# Patient Record
Sex: Male | Born: 1950 | Race: White | Hispanic: Yes | Marital: Married | Smoking: Never smoker
Health system: Southern US, Community
[De-identification: ages and names within clinical notes are randomized; demographics above are authoritative.]

## PROBLEM LIST (undated history)

## (undated) DIAGNOSIS — G4733 Obstructive sleep apnea (adult) (pediatric): Secondary | ICD-10-CM

## (undated) DIAGNOSIS — C801 Malignant (primary) neoplasm, unspecified: Secondary | ICD-10-CM

## (undated) DIAGNOSIS — I471 Supraventricular tachycardia, unspecified: Secondary | ICD-10-CM

## (undated) DIAGNOSIS — M109 Gout, unspecified: Secondary | ICD-10-CM

## (undated) HISTORY — DX: Obstructive sleep apnea (adult) (pediatric): G47.33

## (undated) HISTORY — DX: Supraventricular tachycardia, unspecified: I47.10

## (undated) HISTORY — PX: NO PAST SURGERIES: SHX2092

---

## 2001-08-16 ENCOUNTER — Ambulatory Visit: Admission: RE | Admit: 2001-08-16 | Discharge: 2001-11-14 | Payer: Self-pay | Admitting: Radiation Oncology

## 2002-03-31 ENCOUNTER — Ambulatory Visit: Admission: RE | Admit: 2002-03-31 | Discharge: 2002-03-31 | Payer: Self-pay | Admitting: Radiation Oncology

## 2002-06-30 ENCOUNTER — Ambulatory Visit: Admission: RE | Admit: 2002-06-30 | Discharge: 2002-06-30 | Payer: Self-pay | Admitting: Radiation Oncology

## 2002-11-11 ENCOUNTER — Ambulatory Visit: Admission: RE | Admit: 2002-11-11 | Discharge: 2002-11-11 | Payer: Self-pay | Admitting: Radiation Oncology

## 2003-05-11 ENCOUNTER — Ambulatory Visit: Admission: RE | Admit: 2003-05-11 | Discharge: 2003-05-11 | Payer: Self-pay | Admitting: Radiation Oncology

## 2003-05-18 ENCOUNTER — Ambulatory Visit: Admission: RE | Admit: 2003-05-18 | Discharge: 2003-05-18 | Payer: Self-pay | Admitting: Radiation Oncology

## 2003-06-21 ENCOUNTER — Other Ambulatory Visit: Admission: RE | Admit: 2003-06-21 | Discharge: 2003-06-21 | Payer: Self-pay | Admitting: Otolaryngology

## 2003-10-19 ENCOUNTER — Ambulatory Visit: Admission: RE | Admit: 2003-10-19 | Discharge: 2003-10-19 | Payer: Self-pay | Admitting: Radiation Oncology

## 2003-11-02 ENCOUNTER — Ambulatory Visit: Admission: RE | Admit: 2003-11-02 | Discharge: 2003-11-02 | Payer: Self-pay | Admitting: Radiation Oncology

## 2004-05-02 ENCOUNTER — Ambulatory Visit: Admission: RE | Admit: 2004-05-02 | Discharge: 2004-05-02 | Payer: Self-pay | Admitting: Radiation Oncology

## 2004-06-19 ENCOUNTER — Ambulatory Visit: Admission: RE | Admit: 2004-06-19 | Discharge: 2004-06-19 | Payer: Self-pay | Admitting: Radiation Oncology

## 2005-01-02 ENCOUNTER — Ambulatory Visit: Admission: RE | Admit: 2005-01-02 | Discharge: 2005-01-06 | Payer: Self-pay | Admitting: Radiation Oncology

## 2005-01-03 ENCOUNTER — Ambulatory Visit (HOSPITAL_COMMUNITY): Admission: RE | Admit: 2005-01-03 | Discharge: 2005-01-03 | Payer: Self-pay | Admitting: Radiation Oncology

## 2005-05-16 ENCOUNTER — Inpatient Hospital Stay (HOSPITAL_COMMUNITY): Admission: EM | Admit: 2005-05-16 | Discharge: 2005-05-20 | Payer: Self-pay | Admitting: Emergency Medicine

## 2005-07-17 ENCOUNTER — Ambulatory Visit: Admission: RE | Admit: 2005-07-17 | Discharge: 2005-08-05 | Payer: Self-pay | Admitting: Family Medicine

## 2006-02-03 ENCOUNTER — Ambulatory Visit: Admission: RE | Admit: 2006-02-03 | Discharge: 2006-04-29 | Payer: Self-pay | Admitting: Radiation Oncology

## 2006-02-03 LAB — TSH: TSH: 1.046 u[IU]/mL (ref 0.350–5.500)

## 2006-08-27 ENCOUNTER — Ambulatory Visit: Admission: RE | Admit: 2006-08-27 | Discharge: 2006-11-25 | Payer: Self-pay | Admitting: Radiation Oncology

## 2006-12-03 IMAGING — CR DG CHEST 2V
2 series · 2 of 2 positions shown · non-contrast
Comparison: none

HISTORY: Fever, chills

CHEST 2 VIEWS:
Comparison 01/03/2005
Normal heart size, mediastinal contours, and vascularity.
Chronic bronchitic changes and bilateral apical scarring.
No definite acute infiltrate or effusion. 
No pneumothorax.
Mild scoliosis.

[w chest pa]
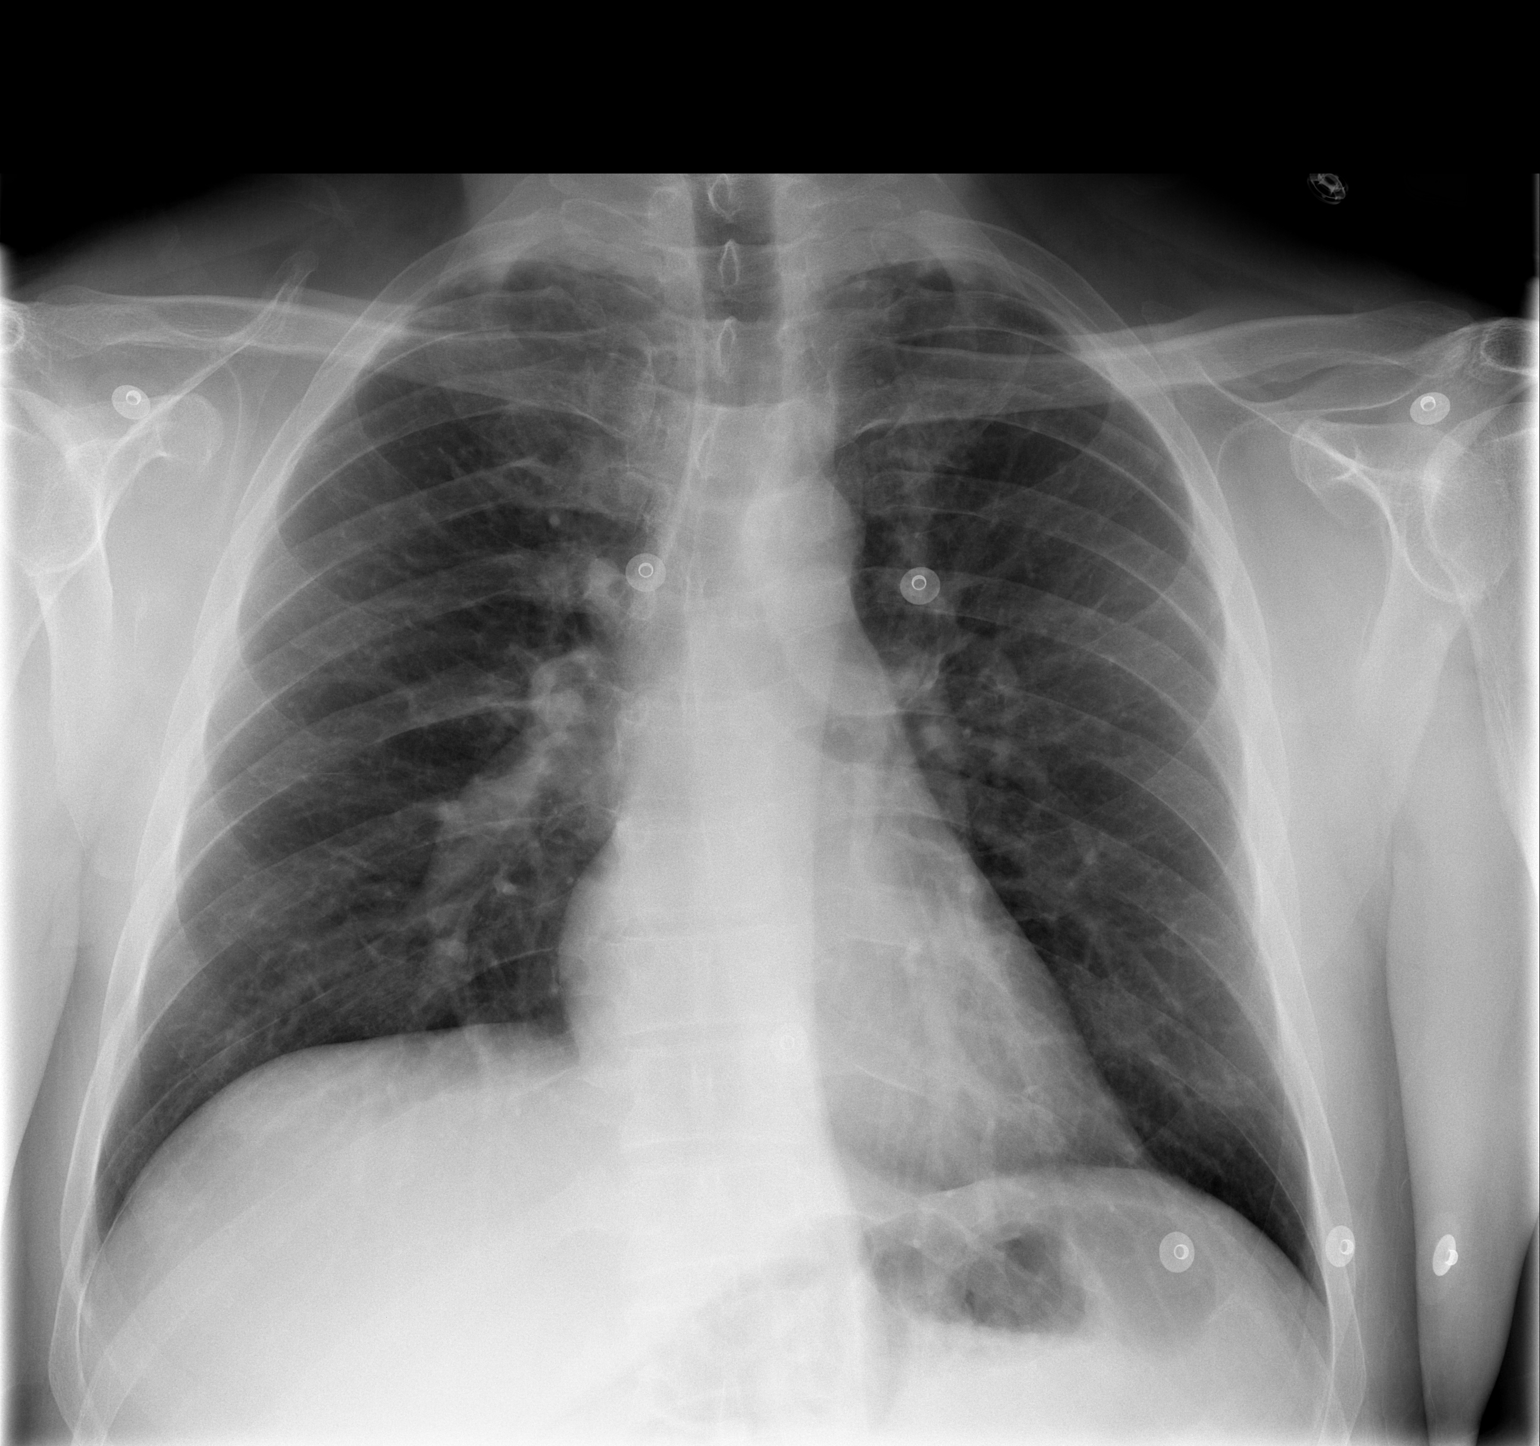

[w chest lat]
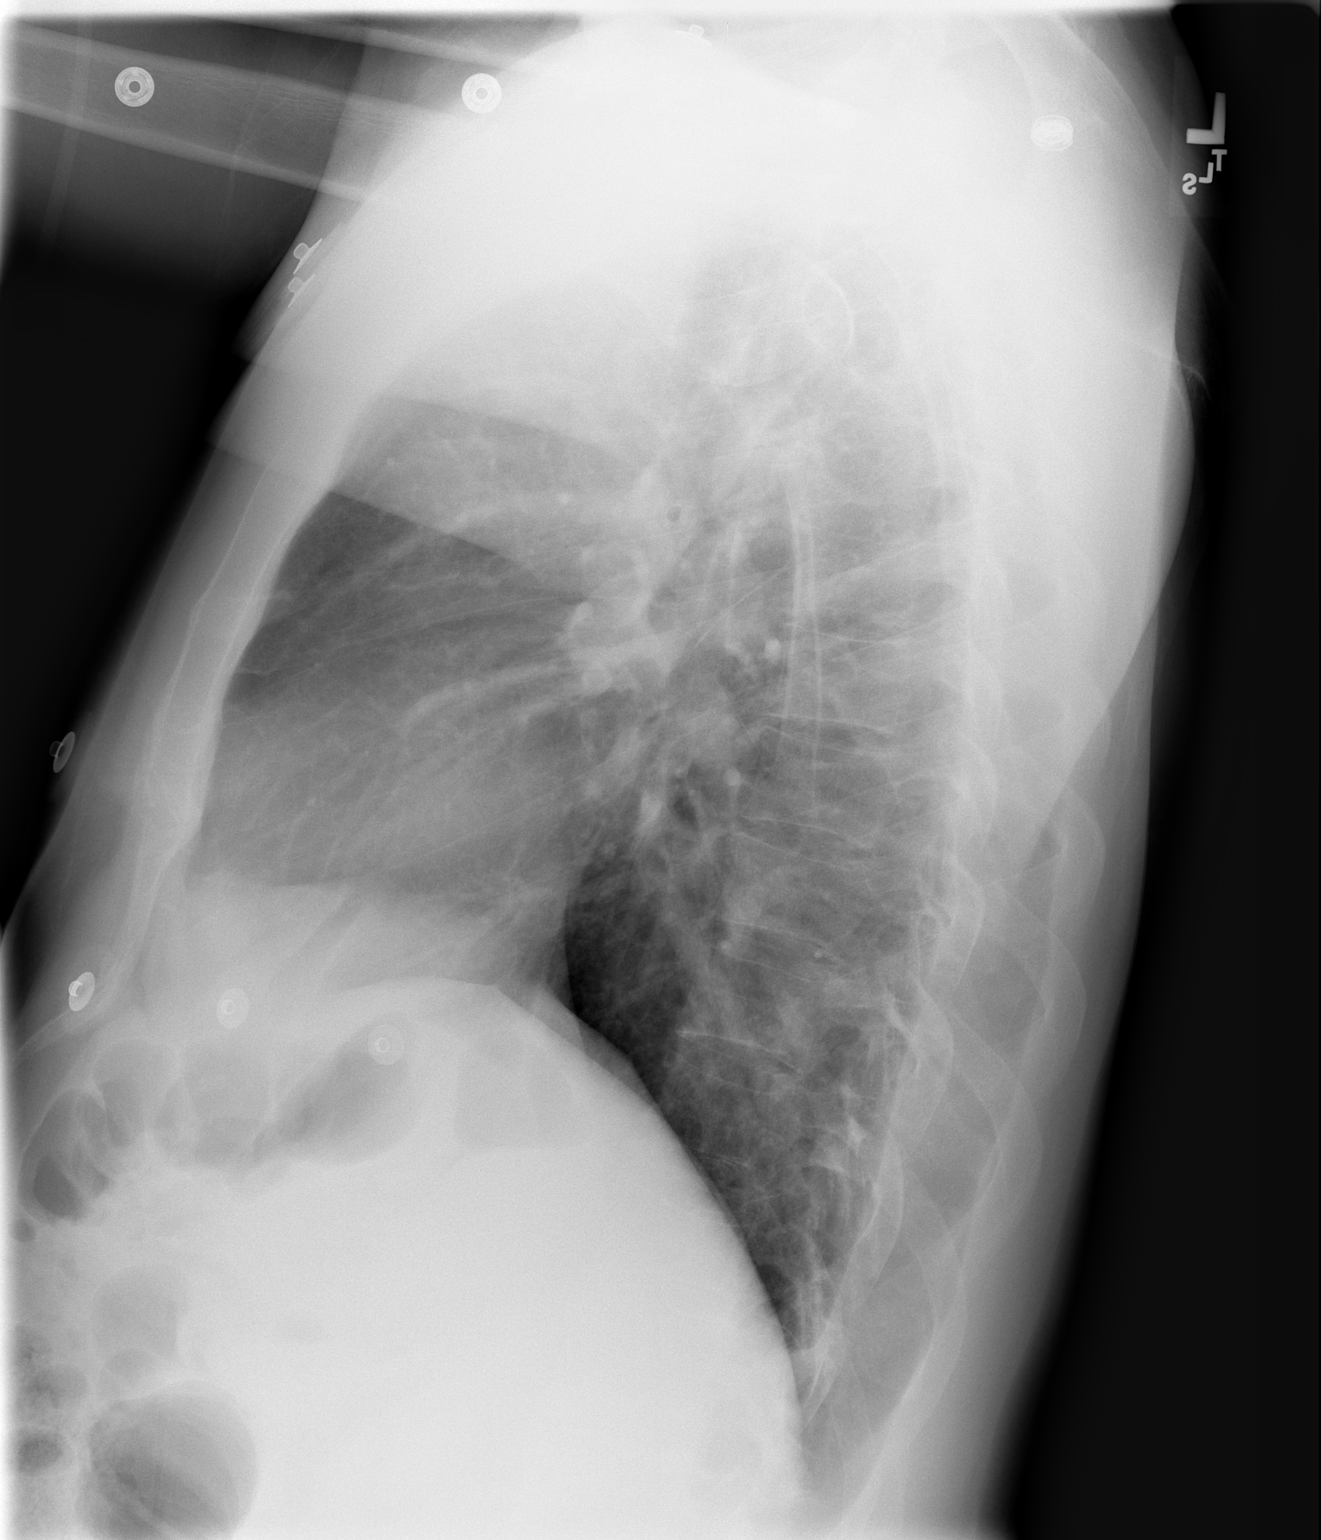

[2 of 2 positions shown; findings below may reference images not displayed]

IMPRESSION: Chronic bronchitic changes with biapical scarring.
No acute abnormalities.

## 2010-07-19 NOTE — H&P (Signed)
[name], [name]               ACCOUNT NO.:  [id number]   MEDICAL RECORD NO.:  [id number]          PATIENT TYPE:  INP   LOCATION:  5504                         FACILITY:  [facility]   PHYSICIAN:  Kela Millin, M.D.DATE OF BIRTH:  1950/07/30   DATE OF ADMISSION:  05/16/2005  DATE OF DISCHARGE:                                HISTORY & PHYSICAL   PRIMARY CARE PHYSICIAN:  Dellis Anes. Heller, M.D.   CHIEF COMPLAINT:  Fevers and chills.   HISTORY OF PRESENT ILLNESS:  The patient is a 60 year old male who presents  with complaints of chills x1 day.  He states that he had had generalized  malaise for a couple of days.  He denies nausea, vomiting, dysuria, cough,  diarrhea, hematochezia, and no hematemesis.  He states that he traveled to  [place] in December of 2006, but he took his malaria prophylaxis.   He was seen in the ER, and a malaria smear was negative.  His temperature  was 102.9.  Urinalysis was consistent with a UTI, and he is admitted to the  [facility] hospitalist service for further evaluation and management.   PAST MEDICAL HISTORY:  1.  History of laryngeal cancer, stage I, status post radiation and in      remission.  2.  History of gout, diet controlled.   MEDICATIONS:  None.   ALLERGIES:  No known drug allergies.   SOCIAL HISTORY:  Denies tobacco.  Also denies alcohol.   FAMILY HISTORY:  His dad has Hodgkin's disease.  His mother died of muscle  cancer in her 30s.   REVIEW OF SYSTEMS:  As per HPI.  Other review of systems negative.   PHYSICAL EXAMINATION:  GENERAL:  The patient is a middle-aged male in no  apparent distress.  VITAL SIGNS:  Temperature is 98.6, initially 102.9.  Blood pressure is  102/63, improved from 97/59.  Pulse is 103.  O2 saturation is 99%.  HEENT:  Pupils equal, round and reactive to light.  Extraocular movements  intact.  Sclerae anicteric.  Slightly dry mucous membranes.  NECK:  Supple.  No adenopathy and no thyromegaly.  No JVD.  LUNGS:   Clear to auscultation bilaterally.  No crackles or wheezes.  CARDIOVASCULAR:  Regular rate and rhythm.  Normal S1 and S2.  ABDOMEN:  Soft.  Bowel sounds present.  Nontender, nondistended.  No  organomegaly and no masses palpable.  EXTREMITIES:  No cyanosis or edema.   LABORATORY DATA:  The white cell count is 9.7, hemoglobin 13.4, hematocrit  37.5, platelet count 246, and the neutrophil count is 96%.  Sodium is 135,  potassium 3.8, chloride 104, BUN 15, glucose 128, pH is 7.51, PCO2 of 32,  and creatinine of 1.0.  Urinalysis is cloudy in appearance.  Specific  gravity 1.020.  Urine nitrite is positive.  Leukocyte esterase is large.  Urine WBCs are 21-50.  Chest x-ray shows bronchitic changes with biapical  scarring.  Otherwise, no acute abnormalities.   ASSESSMENT AND PLAN:  1.  Urinary tract infection with sepsis syndrome.  Obtain blood and urine      cultures.  Empiric antibiotics.  IV fluids and supportive care.  2.  History of laryngeal cancer.  As discussed above, status post radiation,      in remission.  3.  History of gout, diet controlled.      Kela Millin, M.D.  Electronically Signed     ACV/MEDQ  D:  05/16/2005  T:  05/16/2005  Job:  [id number]   cc:   Dellis Anes. Idell Pickles, M.D.  Fax: [phone]

## 2010-07-19 NOTE — Discharge Summary (Signed)
[name], KUMPF NO.:  [id number]   MEDICAL RECORD NO.:  [id number]          PATIENT TYPE:  INP   LOCATION:  5504                         FACILITY:  [facility]   PHYSICIAN:  Jackie Plum, M.D.DATE OF BIRTH:  11/29/1950   DATE OF ADMISSION:  05/15/2005  DATE OF DISCHARGE:  05/20/2005                                 DISCHARGE SUMMARY   ADDENDUM:  The patient was seen on rounds today.  The patient had been seen by Hollice Espy, M.D., yesterday.  He has persistent elevation of his PSA on  repeat.  He denies any fever or chills, nausea or vomiting.  He is feeling  strong and actually wants to go home.  He feels back to his baseline self.  His vital signs are stable with discharge BP of 119/78, pulse 77,  respirations 18, temperature 98.3 degrees Fahrenheit.  I called Dr.  Wanda Plump' office for appointment for the patient to be seen on May 22, 2005, at 1:50 p.m., whereupon he is to be evaluated for his elevated PSA.      Jackie Plum, M.D.  Electronically Signed     GO/MEDQ  D:  05/20/2005  T:  05/21/2005  Job:  [id number]   cc:   [place] [employer], M.D.  Fax: [phone]   Dellis Anes. Idell Pickles, M.D.  Fax: [phone]

## 2010-07-19 NOTE — Discharge Summary (Signed)
[name], [name] NO.:  [id number]   MEDICAL RECORD NO.:  [id number]          PATIENT TYPE:  INP   LOCATION:  5504                         FACILITY:  [facility]   PHYSICIAN:  Hollice Espy, M.D.DATE OF BIRTH:  [birth year]   DATE OF ADMISSION:  05/15/2005  DATE OF DISCHARGE:  05/20/2005                                 DISCHARGE SUMMARY   DISCHARGE DIAGNOSIS:  1.  Escherichia coli urinary tract infection.  2.  Secondary sepsis.  3.  Hypotension brought on by number 2.  4.  Elevated PSA, benign prostatic hypertrophy versus possible prostate      cancer.  5.  History of laryngeal cancer.  6.  History of gout.   DISCHARGE MEDICATIONS:  Cipro 500 mg 1 p.o. b.i.d. x 9 more days for a total  of 14 days of therapy, the patient is currently on no other medications.   HISTORY OF PRESENT ILLNESS:  The patient is a 60 year old white male with  past medical history of laryngeal cancer stage I status post radiation in  remission as well as gout who presented to the emergency room on the day of  admission with complaints of chills and rigors x one day.  He had no other  symptoms although he had traveled to [place] two months prior but told me he  had been taking his malaria prophylaxis.  The patient was seen in the  emergency room  and evaluated and malaria smear was negative.  He was,  however, noted to have a temperature of 102.9.  His urinalysis was  consistent with a UTI, and while he had a normal white count, he was found  to have a 96% neutrophil.  In addition, his blood pressure was slightly low  at 97/59.  The patient was admitted for UTI suspected sepsis and started on  IV Cipro and admitted for further evaluation.  Blood cultures were drawn on  admission.   HOSPITAL COURSE:  Problem 1:  Urinary tract infection and sepsis.  The patient was put on high  dose IV fluids at 150 mL an hour.  He continued to remain borderline low  blood pressure with a systolic no  better than 100 despite continued multiple  liters of IV fluid.  Blood cultures did come back positive for Enterobacter  aerogenes, however, this was still sensitive to Cipro and the patient has  been responding well.  When I discussed my initial concerns with the patient  about his urinary tract infection, I asked him to describe his urinating  habits.  He tells me as of late, he has had problems with inconsistent  draining, sometimes dribbling while urinating.  I suspect that the patient  has prostate issues, however, given his history of laryngeal cancer, I was  concerned that perhaps the other issue maybe a possible prostate cancer.  A  PSA was ordered on May 16, 2005, which came back elevated at 40 on March  17.  A subsequent PSA was ordered on March 17 which came back on March 18  slightly decreased at 39.  I discussed this with  urology and the plan most  likely is to check a follow up PSA which is still pending on the evening of  May 19, 2005.  If still elevated, urology will be formally consulted for a  possible biopsy.  In the meantime, the patient is tolerating antibiotics  well.  The plan will be for the patient to be discharged home on p.o.  antibiotics for another nine days for a total of 14 days of therapy.  Discharge diet will be regular.  Activities will be as tolerated.  He is  being discharged home with follow up in Dr. Jobe Gibbon office in the next 1-2  weeks.  Likely, the patient will need to be  started, if this is benign prostatic hypertrophy, on a BPH reducing agent  such as Proscar or Flomax, however, again we need to first rule out prostate  cancer given his history of laryngeal CA.  In regards to the patient's gout,  this remained stable and was not an active issue.      Hollice Espy, M.D.  Electronically Signed     SKK/MEDQ  D:  05/19/2005  T:  05/20/2005  Job:  [id number]   cc:   Dellis Anes. Idell Pickles, M.D.  Fax: [phone]

## 2012-05-08 ENCOUNTER — Encounter (HOSPITAL_COMMUNITY): Payer: Self-pay | Admitting: Emergency Medicine

## 2012-05-08 ENCOUNTER — Emergency Department (HOSPITAL_COMMUNITY)
Admission: EM | Admit: 2012-05-08 | Discharge: 2012-05-08 | Disposition: A | Discharge status: Home / Self Care | Payer: BC Managed Care – PPO | Attending: Emergency Medicine | Admitting: Emergency Medicine

## 2012-05-08 DIAGNOSIS — W260XXA Contact with knife, initial encounter: Secondary | ICD-10-CM | POA: Insufficient documentation

## 2012-05-08 DIAGNOSIS — Y929 Unspecified place or not applicable: Secondary | ICD-10-CM | POA: Insufficient documentation

## 2012-05-08 DIAGNOSIS — Y939 Activity, unspecified: Secondary | ICD-10-CM | POA: Insufficient documentation

## 2012-05-08 DIAGNOSIS — S61409A Unspecified open wound of unspecified hand, initial encounter: Secondary | ICD-10-CM | POA: Insufficient documentation

## 2012-05-08 HISTORY — DX: Gout, unspecified: M10.9

## 2012-05-08 HISTORY — DX: Malignant (primary) neoplasm, unspecified: C80.1

## 2012-05-08 NOTE — ED Notes (Signed)
Patient with laceration on left posterior hand.  Patient states that he accidentally cut his hand on pocket knife.  Bleeding controlled at this time.

## 2019-03-06 LAB — CBC WITH AUTO DIFFERENTIAL
Absolute Baso #: 0 10*3/uL (ref 0.0–0.2)
Absolute Eos #: 0.1 10*3/uL (ref 0.0–0.5)
Absolute Lymph #: 1 10*3/uL (ref 1.0–3.2)
Absolute Mono #: 0.4 10*3/uL (ref 0.3–1.0)
Basophils %: 0.6 % (ref 0.0–2.0)
Eosinophils %: 1.6 % (ref 0.0–7.0)
Hematocrit: 42.3 % (ref 38.0–52.0)
Hemoglobin: 14.6 g/dL (ref 13.0–17.3)
Immature Grans (Abs): 0.01 10*3/uL (ref 0.00–0.06)
Immature Granulocytes: 0.1 % (ref 0.1–0.6)
Lymphocytes: 14.1 % — ABNORMAL LOW (ref 15.0–45.0)
MCH: 31.4 pg (ref 27.0–34.5)
MCHC: 34.5 g/dL (ref 32.0–36.0)
MCV: 91 fL (ref 84.0–100.0)
MPV: 8.9 fL (ref 7.2–13.2)
Monocytes: 6.3 % (ref 4.0–12.0)
Neutrophils %: 77.3 % — ABNORMAL HIGH (ref 42.0–74.0)
Neutrophils Absolute: 5.5 10*3/uL (ref 1.6–7.3)
Platelets: 257 10*3/uL (ref 140–440)
RBC: 4.65 x10e6/mcL (ref 4.00–5.60)
RDW: 11.6 % (ref 11.0–16.0)
WBC: 7 10*3/uL (ref 3.8–10.6)

## 2019-03-06 LAB — BASIC METABOLIC PANEL
Anion Gap: 10 mmol/L (ref 2–17)
BUN: 17 mg/dL (ref 8–23)
CO2: 26 mmol/L (ref 22–29)
Calcium: 9.3 mg/dL (ref 8.8–10.2)
Chloride: 101 mmol/L (ref 98–107)
Creatinine: 0.8 mg/dL (ref 0.7–1.3)
GFR African American: 107 mL/min/{1.73_m2} (ref >=90)
GFR Non-African American: 92 mL/min/{1.73_m2} (ref >=90)
Glucose: 125 mg/dL — ABNORMAL HIGH (ref 70–99)
OSMOLALITY CALCULATED: 277 mOsm/kg (ref 270–287)
Potassium: 3.8 mmol/L (ref 3.5–5.3)
Sodium: 137 mmol/L (ref 135–145)

## 2019-03-06 LAB — TROPONIN T: Troponin T: <0.01 ng/mL (ref 0.000–0.010)

## 2019-03-06 LAB — TSH WITH REFLEX TO FT4: TSH: 1.41 mcIU/mL (ref 0.358–3.740)

## 2019-03-06 LAB — D-DIMER, QUANTITATIVE: D-Dimer, Quant: <=0.27 mcg/mL FEU (ref 0.19–0.51)

## 2019-03-06 LAB — MAGNESIUM: Magnesium: 2.3 mg/dL (ref 1.6–2.6)

## 2019-03-06 LAB — TROPONIN: TROP 5TH GEN STUDY: 10 ng/L (ref 0.0–12.0)

## 2019-03-06 NOTE — ED Notes (Signed)
ED Patient Education Note     Patient Education Materials Follows:  Cardiovascular     Palpitations    A palpitation is the feeling that your heartbeat is irregular or is faster than normal. It may feel like your heart is fluttering or skipping a beat. Palpitations are usually not a serious problem. However, in some cases, you may need further medical evaluation.      CAUSES    Palpitations can be caused by:     Smoking.     Caffeine or other stimulants, such as diet pills or energy drinks.     Alcohol.     Stress and anxiety.     Strenuous physical activity.     Fatigue.      Certain medicines.     Heart disease, especially if you have a history of irregular heart rhythms (arrhythmias), such as atrial fibrillation, atrial flutter, or supraventricular tachycardia.     An improperly working pacemaker or defibrillator.    DIAGNOSIS    To find the cause of your palpitations, your health care provider will take your medical history and perform a physical exam. Your health care provider may also have you take a test called an ambulatory electrocardiogram (ECG). An ECG records your heartbeat patterns over a 24-hour period. You may also have other tests, such as:     Transthoracic echocardiogram (TTE). During echocardiography, sound waves are used to evaluate how blood flows through your heart.     Transesophageal echocardiogram (TEE).      Cardiac monitoring. This allows your health care provider to monitor your heart rate and rhythm in real time.     Holter monitor. This is a portable device that records your heartbeat and can help diagnose heart arrhythmias. It allows your health care provider to track your heart activity for several days, if needed.     Stress tests by exercise or by giving medicine that makes the heart beat faster.    TREATMENT    Treatment of palpitations depends on the cause of your symptoms and can vary greatly. Most cases of palpitations do not require any treatment other than time, relaxation,  and monitoring your symptoms. Other causes, such as atrial fibrillation, atrial flutter, or supraventricular tachycardia, usually require further treatment.    HOME CARE INSTRUCTIONS     Avoid:    ? Caffeinated coffee, tea, soft drinks, diet pills, and energy drinks.    ? Chocolate.    ? Alcohol.     Stop smoking if you smoke.     Reduce your stress and anxiety. Things that can help you relax include:    ? A method of controlling things in your body, such as your heartbeats, with your mind (biofeedback).    ? Yoga.    ? Meditation.    ? Physical activity such as swimming, jogging, or walking.      Get plenty of rest and sleep.    SEEK MEDICAL CARE IF:     You continue to have a fast or irregular heartbeat beyond 24 hours.     Your palpitations occur more often.    SEEK IMMEDIATE MEDICAL CARE IF:     You have chest pain or shortness of breath.     You have a severe headache.     You feel dizzy or you faint.    MAKE SURE YOU:     Understand these instructions.     Will watch your condition.     Will get   help right away if you are not doing well or get worse.    This information is not intended to replace advice given to you by your health care provider. Make sure you discuss any questions you have with your health care provider.    Document Released: 02/15/2000 Document Revised: 02/22/2013 Document Reviewed: 11/02/2014  Elsevier Interactive Patient Education ?2016 [employer]

## 2019-03-06 NOTE — ED Provider Notes (Signed)
Palpitations-Dysrhythmia *ED        Patient:   Donald Newton, Donald Newton             MRN: [id number]            FIN: [id number]               Age:   69 years     Sex:  Male     DOB:  1950-06-16   Associated Diagnoses:   Palpitations   Author:   Volanda Napoleon      Basic Information   Time seen: Provider Seen (ST)   ED Provider/Time:    Howell Rucks LANE / 03/06/2019 11:37  .   Additional information: Chief Complaint from Nursing Triage Note   Chief Complaint  Chief Complaint: Pt felt like his heart was racing and a little queasy . asa 1/2 adult PTA (03/06/19 11:39:00).      History of Present Illness   Patient is a 69 year old male who presents to the emergency department for evaluation of palpitations.  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  He states he still feels "a little queasy," but otherwise offers no complaints.  He denies any syncope, diaphoresis, headache, chest pain, shortness of breath, abdominal pain, vomiting, lower extremity edema or calf tenderness.  He is currently on diltiazem, as he states his primary care provider told him his heart rate was fast approximately 1 month ago.  He denies any other changes to his routine or medications, known ill contacts, etc. He did take half of an aspirin prior to arrival.  Have a cardiologist known coronary artery disease, history of coagulopathy, DVT, PE or other cardiopulmonary issues.  No excessive caffeine or stimulant use.  He is a non-smoker and denies any illicit drug use..        Review of Systems   Constitutional symptoms:  Fatigue, no fever, no chills.    Eye symptoms:  No pain, no blurred vision.    ENMT symptoms:  No sore throat, no nasal congestion.    Respiratory symptoms:  No shortness of breath, no cough.    Cardiovascular symptoms:  Palpitations, tachycardia, no chest  pain, no syncope, no diaphoresis, no peripheral edema.    Gastrointestinal symptoms:  Nausea, no abdominal pain, no vomiting, no diarrhea.    Musculoskeletal symptoms:  No back pain, no Muscle pain.    Neurologic symptoms:  Dizziness, no headache, no altered level of consciousness.       Health Status   Allergies:    Allergic Reactions (All)  No Known Allergies.   Medications:  (Selected)   Documented Medications  Documented  diltiazem 180 mg/24 hours oral capsule, extended release: 180 mg, 1 cap, Oral, Daily, 30 cap, 0 Refill(s)  tamsulosin 0.4 mg oral capsule: 0.4 mg, 1 caps, Oral, Daily, 30 caps, 0 Refill(s).      Past Medical/ Family/ Social History   Medical history: Reviewed as documented in chart.   Surgical history: Reviewed as documented in chart.   Family history: Not significant.   Social history: Reviewed as documented in chart.   Problem list:    No qualifying data available  , per nurse's notes.      Physical Examination  Vital Signs   Vital Signs   08/05/7844 96:29 EST Systolic Blood Pressure 528 mmHg  HI    Diastolic Blood Pressure 80 mmHg    Temperature Oral 36.9 degC    Heart Rate Monitored 98 bpm    Respiratory Rate 14 br/min    SpO2 100 %   .   Measurements   03/06/2019 11:44 EST Body Mass Index est meas 26.60 kg/m2   03/06/2019 11:44 EST Body Mass Index Measured 26.60 kg/m2   03/06/2019 11:39 EST Height/Length Measured 182 cm    Weight Dosing 88.1 kg   .   General:  Alert, no acute distress.    Skin:  Warm, dry.    Head:  Normocephalic, atraumatic.    Eye:  Extraocular movements are intact, normal conjunctiva.    Cardiovascular:  Regular rate and rhythm, Normal peripheral perfusion, No edema.    Respiratory:  Lungs are clear to auscultation, respirations are non-labored, Symmetrical chest wall expansion.    Chest wall:  No tenderness, No deformity.    Gastrointestinal:  Soft, Nontender.    Neurological:  Alert and oriented to person, place, time, and situation, No focal neurological deficit  observed.    Psychiatric:  Cooperative, appropriate mood & affect, normal judgment.       Medical Decision Making   Differential Diagnosis::  Tachycardia, atrial fibrillation, atrial flutter, paroxysmal supraventricular tachycardia, anemia, premature atrial contraction, premature ventricular contraction, anxiety, hyperthyroidism, sinus tachycardia, medication reaction, electrolyte abnormality.    Rationale:  PA/NP reviewed with co-signing physician: ECG, lab results, medication, diagnosis, and plan of care.   Documents reviewed:  Emergency department nurses' notes.   Electrocardiogram:  Emergency Provider interpretation performed by me, rate 98, normal sinus rhythm, No ST-T changes, no ectopy, normal PR & QRS intervals.    Results review:  Lab results : Lab View   03/06/2019 12:21 EST Estimated Creatinine Clearance 97.34 mL/min   03/06/2019 12:05 EST WBC 7.0 x10e3/mcL    RBC 4.65 x10e6/mcL    Hgb 14.6 g/dL    HCT 42.3 %    MCV 91.0 fL    MCH 31.4 pg    MCHC 34.5 g/dL    RDW 11.6 %    Platelet 257 x10e3/mcL    MPV 8.9 fL    Neutro Auto 77.3 %  HI    Neutro Absolute 5.5 x10e3/mcL    Immature Grans Percent 0.1 %    Immature Grans Absolute 0.01 x10e3/mcL    Lymph Auto 14.1 %  LOW    Lymph Absolute 1.0 x10e3/mcL    Mono Auto 6.3 %    Mono Absolute 0.4 x10e3/mcL    Eosinophil Percent 1.6 %    Eos Absolute 0.1 x10e3/mcL    Basophil Auto 0.6 %    Baso Absolute 0.0 x10e3/mcL    D Dimer <=0.27 mcg/mL FEU    Sodium Lvl 137 mmol/L    Potassium Lvl 3.8 mmol/L    Chloride 101 mmol/L    CO2 26 mmol/L    Glucose Random 125 mg/dL  HI    BUN 17 mg/dL    Creatinine Lvl 0.8 mg/dL    AGAP 10 mmol/L    Osmolality Calc 277 mOsm/kg    Calcium Lvl 9.3 mg/dL    eGFR AA 107 mL/min/1.24m???    eGFR Non-AA 92 mL/min/1.767m??    Magnesium Lvl 2.3 mg/dL    Trop T Quant <0.010 ng/mL    Trop 5th Gen Study 10.0 ng/L    TSH Reflex 1.410 mcIU/mL   .  Notes:  69 year old male who had a brief episode of palpitations, malaise, and registered tachycardia in  the 1 teens on his Fitbit watch.  He denies any associated syncope, shortness of breath, chest discomfort, nausea.  He is otherwise been in his usual state of health.  He is resting comfortably, no acute distress.  Hemodynamically stable and nontoxic in appearance.  Heart sounds are regular and has good peripheral perfusion.  Lungs are clear to auscultation bilaterally.  No lower extremity edema, calf tenderness, or palpable cords.  EKG shows normal sinus rhythm without any evidence of acute ischemia or arrhythmia today.  He is placed on cardiac monitor and no arrhythmias are seen during his stay.  Labs including CBC, BMP, troponin, TSH, D-dimer are reviewed and unremarkable.  He has no significant risk factors for coronary artery disease or PE.  Unsure etiology for symptoms, and will refer to cardiology for further evaluation.  In the meantime, will start him on low-dose aspirin in case he does have some paroxysmal A. fib which was not witnessed here.  No contraindications to this.  We reviewed signs and symptoms which to return to the emergency department and he voices understanding.  Patient is stable for discharge.      Impression and Plan   Diagnosis   Palpitations (ICD10-CM R00.2, Discharge, Medical)   Plan   Condition: Improved, Stable.    Disposition: Discharged: to home.    Patient was given the following educational materials: Palpitations.    Follow up with: JEFFREY RIEDER-MD, Physician - Cardiologist Within 1 week Please follow-up with your primary care provider and recommend establishing care with a cardiologist.  If you call tomorrow morning, they can help you set up an appointment.  Recommend starting a an 81 mg baby aspirin once daily.  Continue your prescribed diltiazem.  Return to the ER for any worsening or new concerning symptoms as discussed..    Counseled: Patient, Regarding diagnosis, Regarding diagnostic results, Regarding treatment plan, Patient indicated understanding of instructions.     Signature Line     Electronically Signed on 03/06/2019 01:09 PM EST   ________________________________________________   Roxanna Mew LANE-PA-C, PA-C               Modified by: Roxanna Mew LANE-PA-C, PA-C on 03/06/2019 12:58 PM EST      Modified by: Roxanna Mew LANE-PA-C, PA-C on 03/06/2019 01:09 PM ESTAddendum by Kevan Rosebush on March 06, 2019 15:31 EST               I was available for review of the patient???s history, exam findings, diagnostics, and a summary of any interventions or procedures for the PA.    Signature Line     Electronically Signed on 03/06/2019 03:31 PM EST   ________________________________________________   Bary Richard HUNTER               Modified by: Kevan Rosebush on 03/06/2019 03:31 PM EST

## 2019-03-06 NOTE — ED Notes (Signed)
ED Triage Note       ED Triage Adult Entered On:  03/06/2019 11:44 EST    Performed On:  03/06/2019 11:39 EST by Lacinda Axon, RN, Colleen P               Triage   Chief Complaint :   Pt felt like his heart was racing and a little queasy . asa 1/2 adult PTA   Chief Complaint Onset :   03/06/2019 10:00 EST   Numeric Rating Pain Scale :   0 = No pain   [place] Mode of Arrival :   Walking   Infectious Disease Documentation :   Document assessment   Temperature Oral :   36.9 degC(Converted to: 98.4 degF)    Heart Rate Monitored :   98 bpm   Respiratory Rate :   14 br/min   Systolic Blood Pressure :   145 mmHg (HI)    Diastolic Blood Pressure :   80 mmHg   SpO2 :   100 %   Oxygen Therapy :   Room air   Patient presentation :   None of the above   Chief Complaint or Presentation suggest infection :   No   Dosing Weight Obtained By :   Measured   Weight Dosing :   88.1 kg(Converted to: 194 lb 4 oz)    Height :   182 cm(Converted to: 6 ft 0 in)    Body Mass Index Dosing :   27 kg/m2   Tollie Pizza P - 03/06/2019 11:39 EST   DCP GENERIC CODE   Tracking Acuity :   3   Tracking Group :   ED Provencal, RN, [place] P - 03/06/2019 11:39 EST   ED General Section :   Document assessment   Pregnancy Status :   N/A   ED Allergies Section :   Document assessment   ED Reason for Visit Section :   Document assessment   ED Home Meds Section :   Document assessment   ED Quick Assessment :   Patient appears awake, alert, oriented to baseline. Skin warm and dry. Moves all extremities. Respiration even and unlabored. Appears in no apparent distress.   Lacinda Axon, RN, [place] P - 03/06/2019 11:39 EST   ID Risk Screen Symptoms   Recent Travel History :   No recent travel   Close Contact with COVID-19 ID :   No   Last 14 days COVID-19 ID :   No   Lacinda Axon RN, Trenda Moots - 03/06/2019 11:39 EST   Allergies   (As Of: 03/06/2019 11:44:17 EST)   Allergies (Active)   No Known Allergies  Estimated Onset Date:   Unspecified ; Created ByLacinda Axon, RN, Trenda Moots;  Reaction Status:   Active ; Category:   Drug ; Substance:   No Known Allergies ; Type:   Allergy ; Updated By:   Lacinda Axon, RN, Trenda Moots; Reviewed Date:   03/06/2019 11:41 EST        Psycho-Social   Last 3 mo, thoughts killing self/others :   Patient denies   ED Behavioral Activity Rating Scale :   4 - Quiet and awake (normal level of activity)   Jerelyn Scott - 03/06/2019 11:39 EST   ED Home Med List   Medication List   (As Of: 03/06/2019 11:44:17 EST)   Home Meds    diltiazem  :   diltiazem ; Status:  Documented ; Ordered As Mnemonic:   diltiazem 180 mg/24 hours oral capsule, extended release ; Simple Display Line:   180 mg, 1 cap, Oral, Daily, 30 cap, 0 Refill(s) ; Catalog Code:   diltiazem ; Order Dt/Tm:   03/06/2019 11:43:57 EST          tamsulosin  :   tamsulosin ; Status:   Documented ; Ordered As Mnemonic:   tamsulosin 0.4 mg oral capsule ; Simple Display Line:   0.4 mg, 1 caps, Oral, Daily, 30 caps, 0 Refill(s) ; Catalog Code:   tamsulosin ; Order Dt/Tm:   03/06/2019 11:44:10 EST            ED Reason for Visit   (As Of: 03/06/2019 11:44:17 EST)   Diagnoses(Active)    Tachycardia  Date:   03/06/2019 ; Diagnosis Type:   Reason For Visit ; Confirmation:   Complaint of ; Clinical Dx:   Tachycardia ; Classification:   Medical ; Clinical Service:   Emergency medicine ; Code:   PNED ; Probability:   0 ; Diagnosis Code:   [id number]

## 2019-03-06 NOTE — ED Notes (Signed)
 ED Patient Summary       ;       [facility]  [address], [place], [place] [address]  [phone]  Discharge Instructions (Patient)  _______________________________________     Name: Donald Newton, Donald Newton  DOB: 12-01-1950                   MRN: [id number]                   FIN: [phone]%[phone]  Reason For Visit: Tachycardia; PREV RAPID HEART RATE  Final Diagnosis: Palpitations     Visit Date: 03/06/2019 11:31:00  Address: [address] [place] [address]  Phone: [phone]     Emergency Department Providers:        Primary Physician:            [facility] would like to thank you for allowing us  to assist you with your healthcare needs. The following includes patient education materials and information regarding your injury/illness.     Follow-up Instructions:  You were seen today on an emergency basis. Please contact your primary care doctor for a follow up appointment. If you received a referral to a specialist doctor, it is important you follow-up as instructed.    It is important that you call your follow-up doctor to schedule and confirm the location of your next appointment. Your doctor may practice at multiple locations. The office location of your follow-up appointment may be different to the one written on your discharge instructions.    If you do not have a primary care doctor, please call [phone] for help in finding a [facility] Provider. For help in finding a specialist doctor, please call [phone].    The [facility] "Ask a Nurse" line in staffed by Registered Nurses and is a free service to the community. We are available Monday - Friday from 8am to 5pm to answer your questions about your health. Please call [phone].    If your condition gets worse before your follow-up with your primary care doctor or specialist, please return to the Emergency Department.        Follow Up Appointments:  Primary Care  Provider:      Name: PCP,  NONE      Phone:                  With: Address: When:   JEFFREY RIEDER-MD, Physician - Cardiologist [address] [place], [place] [address]  [phone] Business (1) Within 1 week   Comments:   Please follow-up with your primary care provider and recommend establishing care with a cardiologist. If you call tomorrow morning, they can help you set up an appointment.   Recommend starting a an 81 mg baby aspirin once daily. Continue your prescribed diltiazem.   Return to the ER for any worsening or new concerning symptoms as discussed.              Printed Prescriptions:    Patient Education Materials:  Discharge Orders          Discharge Patient 03/06/19 13:06:00 EST         Comment:      Palpitations     Palpitations    A palpitation is the feeling that your heartbeat is irregular or is faster than normal. It may feel like your heart is fluttering or skipping a beat. Palpitations are  usually not a serious problem. However, in some cases, you may need further medical evaluation.      CAUSES    Palpitations can be caused by:     Smoking.     Caffeine or other stimulants, such as diet pills or energy drinks.     Alcohol.     Stress and anxiety.     Strenuous physical activity.     Fatigue.      Certain medicines.     Heart disease, especially if you have a history of irregular heart rhythms (arrhythmias), such as atrial fibrillation, atrial flutter, or supraventricular tachycardia.     An improperly working pacemaker or defibrillator.    DIAGNOSIS    To find the cause of your palpitations, your health care provider will take your medical history and perform a physical exam. Your health care provider may also have you take a test called an ambulatory electrocardiogram (ECG). An ECG records your heartbeat patterns over a 24-hour period. You may also have other tests, such as:     Transthoracic echocardiogram (TTE). During echocardiography, sound waves are used to evaluate how blood flows  through your heart.     Transesophageal echocardiogram (TEE).      Cardiac monitoring. This allows your health care provider to monitor your heart rate and rhythm in real time.     Holter monitor. This is a portable device that records your heartbeat and can help diagnose heart arrhythmias. It allows your health care provider to track your heart activity for several days, if needed.     Stress tests by exercise or by giving medicine that makes the heart beat faster.    TREATMENT    Treatment of palpitations depends on the cause of your symptoms and can vary greatly. Most cases of palpitations do not require any treatment other than time, relaxation, and monitoring your symptoms. Other causes, such as atrial fibrillation, atrial flutter, or supraventricular tachycardia, usually require further treatment.    HOME CARE INSTRUCTIONS     Avoid:    ? Caffeinated coffee, tea, soft drinks, diet pills, and energy drinks.    ? Chocolate.    ? Alcohol.     Stop smoking if you smoke.     Reduce your stress and anxiety. Things that can help you relax include:    ? A method of controlling things in your body, such as your heartbeats, with your mind (biofeedback).    ? Yoga.    ? Meditation.    ? Physical activity such as swimming, jogging, or walking.      Get plenty of rest and sleep.    SEEK MEDICAL CARE IF:     You continue to have a fast or irregular heartbeat beyond 24 hours.     Your palpitations occur more often.    SEEK IMMEDIATE MEDICAL CARE IF:     You have chest pain or shortness of breath.     You have a severe headache.     You feel dizzy or you faint.    MAKE SURE YOU:     Understand these instructions.     Will watch your condition.     Will get help right away if you are not doing well or get worse.    This information is not intended to replace advice given to you by your health care provider. Make sure you discuss any questions you have with your health care provider.    Document Released: 02/15/2000 Document  Revised: 02/22/2013 Document Reviewed: 11/02/2014  Elsevier Interactive Patient Education ?2016 [employer]vier Inc.         Allergy Info: No Known Allergies     Medication Information:  [facility] provided you with a complete list of medications post discharge, if you have been instructed to stop taking a medication please ensure you also follow up with this information to your Primary Care Physician.  Unless otherwise noted, patient will continue to take medications as prescribed prior to the Emergency Room visit.  Any specific questions regarding your chronic medications and dosages should be discussed with your physician(s) and pharmacist.          diltiazem (diltiazem 180 mg/24 hours oral capsule, extended release) 1 Capsules Oral (given by mouth) every day.  tamsulosin (tamsulosin 0.4 mg oral capsule) 1 Capsules Oral (given by mouth) every day.      Medications Administered During Visit:       Major Tests and Procedures:  The following procedures and tests were performed during your Emergency Room visit.  COMMON PROCEDURES%>  COMMON PROCEDURES COMMENTS%>          Laboratory Orders  Name Status Details   .Trop 5th Gen Completed Blood, Stat, ST - Stat, Collected, 03/06/19 12:05:00 EST, 03/06/19 12:05:00 EST, Nurse collect, 03/06/19 12:05:00 EST, FARLEY-PA-C,  [address], [phone]   BMP Completed Blood, Stat, ST - Stat, 03/06/19 11:52:00 EST, 03/06/19 11:52:00 EST, Nurse collect, FARLEY-PA-C,  [address], Print label Y/N   CBCDIFF Completed Blood, Stat, ST - Stat, 03/06/19 11:52:00 EST, 03/06/19 11:52:00 EST, Nurse collect, FARLEY-PA-C,  [address], Print label Y/N   D Dimer Completed Blood, Stat, ST - Stat, 03/06/19 11:52:00 EST, 03/06/19 11:52:00 EST, Nurse collect, FARLEY-PA-C,  [address], Print label Y/N   Mg Completed Blood, Stat, ST - Stat, 03/06/19 11:52:00 EST, 03/06/19 11:52:00 EST, Nurse collect, FARLEY-PA-C,  [address], Print label Y/N   TSH Rfx Completed Blood, Stat, ST -  Stat, 03/06/19 11:52:00 EST, 03/06/19 11:52:00 EST, Nurse collect, FARLEY-PA-C,  [address], Print label Y/N   Trop T Completed Blood, Stat, ST - Stat, 03/06/19 11:52:00 EST, 03/06/19 11:52:00 EST, Nurse collect, FARLEY-PA-C,  [address], Print label Y/N               Radiology Orders  No radiology orders were placed.              Patient Care Orders  Name Status Details   Cardiac/NIBP/Pulse Ox Monitoring Completed 03/06/19 11:37:00 EST, This message can only be seen by Nursing, it is not visible to Pharmacy, Laboratory, or Radiology., 03/06/19 11:37:00 EST, 03/06/19 11:37:00 EST, Once   Cardiac/NIBP/Pulse Ox Monitoring Completed 03/06/19 11:52:00 EST, This message can only be seen by Nursing, it is not visible to Pharmacy, Laboratory, or Radiology., 03/06/19 11:52:00 EST, 03/06/19 11:52:00 EST, Once   Discharge Patient Ordered 03/06/19 13:06:00 EST   ED Assessment Adult Completed 03/06/19 11:44:18 EST, 03/06/19 11:44:18 EST   ED Only Oxygen Therapy Completed 03/06/19 11:52:00 EST, STAT 1 hour or less, 03/06/19 11:52:00 EST, Keep SAT > 92%   ED Secondary Triage Completed 03/06/19 11:44:18 EST, 03/06/19 11:44:18 EST   ED Triage Adult Completed 03/06/19 11:31:40 EST, 03/06/19 11:31:40 EST   Saline Lock Insert Completed 03/06/19 11:52:00 EST, Once, 03/06/19 11:52:00 EST       ---------------------------------------------------------------------------------------------------------------------  [facility]) encourages you to self-enroll in the [facility] Patient Portal.  The [facility] Patient Portal will allow you to manage your  personal health information securely from your own electronic device now and in the future.  To begin your Patient Portal enrollment process, please visit [email or web address] Click on "Sign up now" under [facility].  If you find that you need additional assistance on the [facility] Patient Portal or need a copy of your medical records, please call  the [facility] Medical Records Office at [phone].  Comment:

## 2019-03-06 NOTE — ED Notes (Signed)
ED Triage Note       ED Secondary Triage Entered On:  03/06/2019 12:34 EST    Performed On:  03/06/2019 12:34 EST by Lequita Halt, RN, Diane C               General Information   Barriers to Learning :   None evident   ED Home Meds Section :   Document assessment   [facility] ED Fall Risk Section :   Document assessment   ED Advance Directives Section :   Document assessment   ED Palliative Screen :   Document assessment   Tressa Busman - 03/06/2019 12:34 EST   (As Of: 03/06/2019 12:34:46 EST)   Diagnoses(Active)    Tachycardia  Date:   03/06/2019 ; Diagnosis Type:   Reason For Visit ; Confirmation:   Complaint of ; Clinical Dx:   Tachycardia ; Classification:   Medical ; Clinical Service:   Emergency medicine ; Code:   PNED ; Probability:   0 ; Diagnosis Code:   [id number]             -    Procedure History   (As Of: 03/06/2019 12:34:46 EST)     Phoebe Perch Fall Risk Assessment Tool   Hx of falling last 3 months ED Fall :   No   Tressa Busman - 03/06/2019 12:34 EST   ED Advance Directive   Advance Directive :   No   Lequita Halt RN, Diane C - 03/06/2019 12:34 EST   Palliative Care   Does the Patient have a Life Limiting Illness :   Unable to determine   Tressa Busman - 03/06/2019 12:34 EST

## 2019-03-06 NOTE — Discharge Summary (Signed)
 ED Clinical Summary                     [facility]  [address]  [place], [place] [address]  [phone]          PERSON INFORMATION  Name: Donald Newton, Donald Newton Age:  69 Years DOB: December 23, 1950   Sex: Male Language: English PCP: PCP,  NONE   Marital Status: Married Phone: [phone] Med Service: MED-Medicine   MRN: [id number] Acct# [id number] Arrival: 03/06/2019 11:31:00   Visit Reason: Tachycardia; PREV RAPID HEART RATE Acuity: 3 LOS: 000 01:46   Address:    [address] [place] [address]   Diagnosis:    Palpitations  Medications:          Medications that have not changed  Other Medications  diltiazem (diltiazem 180 mg/24 hours oral capsule, extended release) 1 Capsules Oral (given by mouth) every day.  Last Dose:____________________  tamsulosin (tamsulosin 0.4 mg oral capsule) 1 Capsules Oral (given by mouth) every day.  Last Dose:____________________      Medications Administered During Visit:              Allergies      No Known Allergies      Major Tests and Procedures:  The following procedures and tests were performed during your ED visit.  COMMON PROCEDURES%>  COMMON PROCEDURES COMMENTS%>                PROVIDER INFORMATION               Provider Role Assigned Sampson DIANTHA CREDIT [facility] ED MidLevel 03/06/2019 11:37:03    Joesph RN, Loa BROCKS ED Nurse 03/06/2019 11:56:24        Attending Physician:  DUTCH NELWYN LUKES      Admit Doc  LOUIS-MD,  RUSSELL HUNTER     Consulting Doc       VITALS INFORMATION  Vital Sign Triage Latest   Temp Oral ORAL_1%> ORAL%>   Temp Temporal TEMPORAL_1%> TEMPORAL%>   Temp Intravascular INTRAVASCULAR_1%> INTRAVASCULAR%>   Temp Axillary AXILLARY_1%> AXILLARY%>   Temp Rectal RECTAL_1%> RECTAL%>   02 Sat 100 % 96 %   Respiratory Rate RATE_1%> RATE%>   Peripheral Pulse Rate PULSE RATE_1%>84 bpm PULSE RATE%>   Apical Heart Rate HEART RATE_1%> HEART RATE%>   Blood Pressure BLOOD PRESSURE_1%>/ BLOOD PRESSURE_1%>80 mmHg BLOOD  PRESSURE%> / BLOOD PRESSURE%>76 mmHg                 Immunizations      No Immunizations Documented This Visit          DISCHARGE INFORMATION   Discharge Disposition: H Outpt-Sent Home   Discharge Location:  Home   Discharge Date and Time:  03/06/2019 13:17:29   ED Checkout Date and Time:  03/06/2019 13:17:29     DEPART REASON INCOMPLETE INFORMATION               Depart Action Incomplete Reason   Interactive View/I&O Recently assessed               Problems      No Problems Documented              Smoking Status      No Smoking Status Documented         PATIENT EDUCATION INFORMATION  Instructions:     Palpitations     Follow up:  With: Address: When:   JEFFREY RIEDER-MD, Physician - Cardiologist [address] [place], [place] [address]  [phone] Business (1) Within 1 week   Comments:   Please follow-up with your primary care provider and recommend establishing care with a cardiologist. If you call tomorrow morning, they can help you set up an appointment.   Recommend starting a an 81 mg baby aspirin once daily. Continue your prescribed diltiazem.   Return to the ER for any worsening or new concerning symptoms as discussed.              ED PROVIDER DOCUMENTATION     Patient:   Donald Newton, Donald Newton             MRN: [id number]            FIN: [id number]               Age:   69 years     Sex:  Male     DOB:  December 23, 1950   Associated Diagnoses:   Palpitations   Author:   DIANTHA CAMIE HAMMERSMITH      Basic Information   Time seen: Provider Seen (ST)   ED Provider/Time:    DIANTHA CAMIE LANE / 03/06/2019 11:37  .   Additional information: Chief Complaint from Nursing Triage Note   Chief Complaint  Chief Complaint: Pt felt like his heart was racing and a little queasy . asa 1/2 adult PTA (03/06/19 11:39:00).      History of Present Illness   Patient is a 69 year old male who presents to the emergency department for evaluation of palpitations.  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  He states he checked his Fitbit watch and noticed that his heart rate was 114 bpm prompting them to come to the emergency department.  He states he still feels a little queasy, but otherwise offers no complaints.  He denies any syncope, diaphoresis, headache, chest pain, shortness of breath, abdominal pain, vomiting, lower extremity edema or calf tenderness.  He is currently on diltiazem, as he states his primary care provider told him his heart rate was fast approximately 1 month ago.  He denies any other changes to his routine or medications, known ill contacts, etc. He did take half of an aspirin prior to arrival.  Have a cardiologist known coronary artery disease, history of coagulopathy, DVT, PE or other cardiopulmonary issues.  No excessive caffeine or stimulant use.  He is a non-smoker and denies any illicit drug use..        Review of Systems   Constitutional symptoms:  Fatigue, no fever, no chills.    Eye symptoms:  No pain, no blurred vision.    ENMT symptoms:  No sore throat, no nasal congestion.    Respiratory symptoms:  No shortness of breath, no cough.    Cardiovascular symptoms:  Palpitations, tachycardia, no chest pain, no syncope, no diaphoresis, no peripheral edema.    Gastrointestinal symptoms:  Nausea, no abdominal pain, no vomiting, no diarrhea.    Musculoskeletal symptoms:  No back pain, no Muscle pain.    Neurologic symptoms:  Dizziness, no headache, no altered level of consciousness.       Health Status   Allergies:    Allergic Reactions (All)  No Known Allergies.   Medications:  (Selected)   Documented Medications  Documented  diltiazem 180 mg/24 hours oral capsule, extended release: 180 mg, 1 cap, Oral,  Daily, 30 cap, 0 Refill(s)  tamsulosin 0.4 mg oral capsule: 0.4 mg, 1 caps, Oral, Daily, 30 caps, 0 Refill(s).      Past Medical/ Family/ Social History   Medical history: Reviewed as documented in chart.   Surgical history: Reviewed as documented in chart.   Family  history: Not significant.   Social history: Reviewed as documented in chart.   Problem list:    No qualifying data available  , per nurse's notes.      Physical Examination               Vital Signs   Vital Signs   03/06/2019 11:39 EST Systolic Blood Pressure 145 mmHg  HI    Diastolic Blood Pressure 80 mmHg    Temperature Oral 36.9 degC    Heart Rate Monitored 98 bpm    Respiratory Rate 14 br/min    SpO2 100 %   .   Measurements   03/06/2019 11:44 EST Body Mass Index est meas 26.60 kg/m2   03/06/2019 11:44 EST Body Mass Index Measured 26.60 kg/m2   03/06/2019 11:39 EST Height/Length Measured 182 cm    Weight Dosing 88.1 kg   .   General:  Alert, no acute distress.    Skin:  Warm, dry.    Head:  Normocephalic, atraumatic.    Eye:  Extraocular movements are intact, normal conjunctiva.    Cardiovascular:  Regular rate and rhythm, Normal peripheral perfusion, No edema.    Respiratory:  Lungs are clear to auscultation, respirations are non-labored, Symmetrical chest wall expansion.    Chest wall:  No tenderness, No deformity.    Gastrointestinal:  Soft, Nontender.    Neurological:  Alert and oriented to person, place, time, and situation, No focal neurological deficit observed.    Psychiatric:  Cooperative, appropriate mood & affect, normal judgment.       Medical Decision Making   Differential Diagnosis::  Tachycardia, atrial fibrillation, atrial flutter, paroxysmal supraventricular tachycardia, anemia, premature atrial contraction, premature ventricular contraction, anxiety, hyperthyroidism, sinus tachycardia, medication reaction, electrolyte abnormality.    Rationale:  PA/NP reviewed with co-signing physician: ECG, lab results, medication, diagnosis, and plan of care.   Documents reviewed:  Emergency department nurses' notes.   Electrocardiogram:  Emergency Provider interpretation performed by me, rate 98, normal sinus rhythm, No ST-T changes, no ectopy, normal PR & QRS intervals.    Results review:  Lab results : Lab View    03/06/2019 12:21 EST Estimated Creatinine Clearance 97.34 mL/min   03/06/2019 12:05 EST WBC 7.0 x10e3/mcL    RBC 4.65 x10e6/mcL    Hgb 14.6 g/dL    HCT 57.6 %    MCV 08.9 fL    MCH 31.4 pg    MCHC 34.5 g/dL    RDW 88.3 %    Platelet 257 x10e3/mcL    MPV 8.9 fL    Neutro Auto 77.3 %  HI    Neutro Absolute 5.5 x10e3/mcL    Immature Grans Percent 0.1 %    Immature Grans Absolute 0.01 x10e3/mcL    Lymph Auto 14.1 %  LOW    Lymph Absolute 1.0 x10e3/mcL    Mono Auto 6.3 %    Mono Absolute 0.4 x10e3/mcL    Eosinophil Percent 1.6 %    Eos Absolute 0.1 x10e3/mcL    Basophil Auto 0.6 %    Baso Absolute 0.0 x10e3/mcL    D Dimer <=0.27 mcg/mL FEU    Sodium Lvl 137 mmol/L    Potassium Lvl  3.8 mmol/L    Chloride 101 mmol/L    CO2 26 mmol/L    Glucose Random 125 mg/dL  HI    BUN 17 mg/dL    Creatinine Lvl 0.8 mg/dL    AGAP 10 mmol/L    Osmolality Calc 277 mOsm/kg    Calcium Lvl 9.3 mg/dL    eGFR AA 892 fO/fpw/8.26f    eGFR Non-AA 92 mL/min/1.36m    Magnesium Lvl 2.3 mg/dL    Trop T Quant <9.989 ng/mL    Trop 5th Gen Study 10.0 ng/L    TSH Reflex 1.410 mcIU/mL   .   Notes:  69 year old male who had a brief episode of palpitations, malaise, and registered tachycardia in the 1 teens on his Fitbit watch.  He denies any associated syncope, shortness of breath, chest discomfort, nausea.  He is otherwise been in his usual state of health.  He is resting comfortably, no acute distress.  Hemodynamically stable and nontoxic in appearance.  Heart sounds are regular and has good peripheral perfusion.  Lungs are clear to auscultation bilaterally.  No lower extremity edema, calf tenderness, or palpable cords.  EKG shows normal sinus rhythm without any evidence of acute ischemia or arrhythmia today.  He is placed on cardiac monitor and no arrhythmias are seen during his stay.  Labs including CBC, BMP, troponin, TSH, D-dimer are reviewed and unremarkable.  He has no significant risk factors for coronary artery disease or PE.  Unsure etiology  for symptoms, and will refer to cardiology for further evaluation.  In the meantime, will start him on low-dose aspirin in case he does have some paroxysmal A. fib which was not witnessed here.  No contraindications to this.  We reviewed signs and symptoms which to return to the emergency department and he voices understanding.  Patient is stable for discharge.      Impression and Plan   Diagnosis   Palpitations (ICD10-CM R00.2, Discharge, Medical)   Plan   Condition: Improved, Stable.    Disposition: Discharged: to home.    Patient was given the following educational materials: Palpitations.    Follow up with: JEFFREY RIEDER-MD, Physician - Cardiologist Within 1 week Please follow-up with your primary care provider and recommend establishing care with a cardiologist.  If you call tomorrow morning, they can help you set up an appointment.  Recommend starting a an 81 mg baby aspirin once daily.  Continue your prescribed diltiazem.  Return to the ER for any worsening or new concerning symptoms as discussed..    Counseled: Patient, Regarding diagnosis, Regarding diagnostic results, Regarding treatment plan, Patient indicated understanding of instructions.

## 2019-10-06 LAB — MAGNESIUM: Magnesium: 2.3 mg/dL (ref 1.6–2.6)

## 2019-11-25 LAB — MAGNESIUM: Magnesium: 2.7 mg/dL — ABNORMAL HIGH (ref 1.6–2.6)

## 2021-01-31 ENCOUNTER — Ambulatory Visit (INDEPENDENT_AMBULATORY_CARE_PROVIDER_SITE_OTHER): Payer: Medicare Other

## 2021-01-31 ENCOUNTER — Ambulatory Visit (INDEPENDENT_AMBULATORY_CARE_PROVIDER_SITE_OTHER): Payer: Medicare Other | Admitting: Podiatry

## 2021-01-31 ENCOUNTER — Other Ambulatory Visit: Payer: Self-pay

## 2021-01-31 DIAGNOSIS — M7752 Other enthesopathy of left foot: Secondary | ICD-10-CM

## 2021-01-31 DIAGNOSIS — S90222A Contusion of left lesser toe(s) with damage to nail, initial encounter: Secondary | ICD-10-CM | POA: Diagnosis not present

## 2021-01-31 DIAGNOSIS — S99922A Unspecified injury of left foot, initial encounter: Secondary | ICD-10-CM

## 2021-01-31 DIAGNOSIS — M7751 Other enthesopathy of right foot: Secondary | ICD-10-CM

## 2021-01-31 DIAGNOSIS — M79671 Pain in right foot: Secondary | ICD-10-CM

## 2021-01-31 DIAGNOSIS — B351 Tinea unguium: Secondary | ICD-10-CM

## 2021-01-31 NOTE — Patient Instructions (Signed)
I have ordered a medication for you that will come from [facility] in [place]. They should be calling you to verify insurance and will mail the medication to you. If you live close by then you can go by their pharmacy to pick up the medication. Their phone number is [phone]. If you do not hear from them in the next few days, please give us a call at [phone].   

## 2021-02-03 NOTE — Progress Notes (Signed)
Subjective:   Patient ID: [name], male   DOB: 70 y.o.   MRN: [id number]   HPI 70 year old male presents the office with concerns of toenail discoloration, thickening of the right side worse than left.  He has tried hydrogen peroxide for the fungus on the right side.  This been on the past 2 to 3 months.  On his left big toenail he did hit his toenail on the back of his right ankle and since then noticing discoloration, black discoloration to the toenail.  Occasional discomfort but not causing significant pain.  No drainage or pus.  No other treatment.  No other concerns.   Review of Systems  All other systems reviewed and are negative.  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   Current Outpatient Medications:    NONFORMULARY OR COMPOUNDED ITEM, [facility]  Antifungal nail #1 Apply to the affected nail (s) once (at bedtime) or twice daily Refills PRN, Disp: , Rfl:   No Known Allergies        Objective:  Physical Exam  General: AAO x3, NAD  Dermatological: Bilateral hallux nails are hypertrophic, dystrophic with yellow-brown discoloration of the right side worse than left.  On the left side there is loosening of the nail from the underlying nailbed with dried subungual hematoma but there is no active bleeding.  There is no sensory hyperpigmentation of the surrounding skin.  No edema, erythema.  No open lesions.  Vascular: Dorsalis Pedis artery and Posterior Tibial artery pedal pulses are 2/4 bilateral with immedate capillary fill time. There is no pain with calf compression, swelling, warmth, erythema.   Neruologic: Grossly intact via light touch bilateral.   Musculoskeletal: No gross boney pedal deformities bilateral. No pain, crepitus, or limitation noted with foot and ankle range of motion bilateral. Muscular strength 5/5 in all groups tested bilateral.  Gait: Unassisted, Nonantalgic.        Assessment:   Onychomycosis, onychodystrophy     Plan:  -Treatment options discussed including all alternatives, risks, and complications -Etiology of symptoms were discussed -X-rays obtained reviewed.  No evidence of acute fracture or significant bone spur present. -We discussed various treatment options for the toenails.  Discussed with nail removal but he has an upcoming vacation planned so would recommend holding off on this nail.  Discussed oral versus topical medication.  He had side effects of the medication so hesitant to oral.  We will try for the compound cream through [facility] for nail fungus.  Discussed side effects, success rates.  Return in about 3 months (around 05/01/2021) for nail fungus.  Trula Slade DPM

## 2021-02-04 ENCOUNTER — Telehealth: Payer: Self-pay | Admitting: Podiatry

## 2021-02-04 NOTE — Telephone Encounter (Signed)
Patient called to check on medication he is going out of town and wants to having it before he leaves.  Patient wants to know is it coming to the office or to his home.   NONFORMULARY OR COMPOUNDED ITEM [[id number]]

## 2021-02-05 ENCOUNTER — Other Ambulatory Visit: Payer: Self-pay | Admitting: Podiatry

## 2021-02-05 DIAGNOSIS — M7751 Other enthesopathy of right foot: Secondary | ICD-10-CM

## 2021-02-05 DIAGNOSIS — M7752 Other enthesopathy of left foot: Secondary | ICD-10-CM

## 2021-02-05 NOTE — Telephone Encounter (Signed)
I called [facility] and they said they would not send it out to the patient because it was not covered by insurance, and they were unable to contact the patient.  I called the patient and transferred them to [place] so that they could discuss if the patient still wanted the medication.

## 2021-02-20 NOTE — Telephone Encounter (Signed)
Patient did received the [facility],currently using and does seem to help, will f/u with doctor in March.

## 2021-05-02 ENCOUNTER — Ambulatory Visit (INDEPENDENT_AMBULATORY_CARE_PROVIDER_SITE_OTHER): Payer: Medicare Other | Admitting: Podiatry

## 2021-05-02 ENCOUNTER — Other Ambulatory Visit: Payer: Self-pay

## 2021-05-02 DIAGNOSIS — B351 Tinea unguium: Secondary | ICD-10-CM | POA: Diagnosis not present

## 2021-05-02 NOTE — Patient Instructions (Signed)
You can use "voltaren gel" on the back of the heels  Achilles Tendinitis  with Rehab Achilles tendinitis is a disorder of the Achilles tendon. The Achilles tendon connects the large calf muscles (Gastrocnemius and Soleus) to the heel bone (calcaneus). This tendon is sometimes called the heel cord. It is important for pushing-off and standing on your toes and is important for walking, running, or jumping. Tendinitis is often caused by overuse and repetitive microtrauma. SYMPTOMS Pain, tenderness, swelling, warmth, and redness may occur over the Achilles tendon even at rest. Pain with pushing off, or flexing or extending the ankle. Pain that is worsened after or during activity. CAUSES  Overuse sometimes seen with rapid increase in exercise programs or in sports requiring running and jumping. Poor physical conditioning (strength and flexibility or endurance). Running sports, especially training running down hills. Inadequate warm-up before practice or play or failure to stretch before participation. Injury to the tendon. PREVENTION  Warm up and stretch before practice or competition. Allow time for adequate rest and recovery between practices and competition. Keep up conditioning. Keep up ankle and leg flexibility. Improve or keep muscle strength and endurance. Improve cardiovascular fitness. Use proper technique. Use proper equipment (shoes, skates). To help prevent recurrence, taping, protective strapping, or an adhesive bandage may be recommended for several weeks after healing is complete. PROGNOSIS  Recovery may take weeks to several months to heal. Longer recovery is expected if symptoms have been prolonged. Recovery is usually quicker if the inflammation is due to a direct blow as compared with overuse or sudden strain. RELATED COMPLICATIONS  Healing time will be prolonged if the condition is not correctly treated. The injury must be given plenty of time to heal. Symptoms can  reoccur if activity is resumed too soon. Untreated, tendinitis may increase the risk of tendon rupture requiring additional time for recovery and possibly surgery. TREATMENT  The first treatment consists of rest anti-inflammatory medication, and ice to relieve the pain. Stretching and strengthening exercises after resolution of pain will likely help reduce the risk of recurrence. Referral to a physical therapist or athletic trainer for further evaluation and treatment may be helpful. A walking boot or cast may be recommended to rest the Achilles tendon. This can help break the cycle of inflammation and microtrauma. Arch supports (orthotics) may be prescribed or recommended by your caregiver as an adjunct to therapy and rest. Surgery to remove the inflamed tendon lining or degenerated tendon tissue is rarely necessary and has shown less than predictable results. MEDICATION  Nonsteroidal anti-inflammatory medications, such as aspirin and ibuprofen, may be used for pain and inflammation relief. Do not take within 7 days before surgery. Take these as directed by your caregiver. Contact your caregiver immediately if any bleeding, stomach upset, or signs of allergic reaction occur. Other minor pain relievers, such as acetaminophen, may also be used. Pain relievers may be prescribed as necessary by your caregiver. Do not take prescription pain medication for longer than 4 to 7 days. Use only as directed and only as much as you need. Cortisone injections are rarely indicated. Cortisone injections may weaken tendons and predispose to rupture. It is better to give the condition more time to heal than to use them. HEAT AND COLD Cold is used to relieve pain and reduce inflammation for acute and chronic Achilles tendinitis. Cold should be applied for 10 to 15 minutes every 2 to 3 hours for inflammation and pain and immediately after any activity that aggravates your symptoms. Use ice  packs or an ice massage. Heat  may be used before performing stretching and strengthening activities prescribed by your caregiver. Use a heat pack or a warm soak. SEEK MEDICAL CARE IF: Symptoms get worse or do not improve in 2 weeks despite treatment. New, unexplained symptoms develop. Drugs used in treatment may produce side effects.  EXERCISES:  RANGE OF MOTION (ROM) AND STRETCHING EXERCISES - Achilles Tendinitis  These exercises may help you when beginning to rehabilitate your injury. Your symptoms may resolve with or without further involvement from your physician, physical therapist or athletic trainer. While completing these exercises, remember:  Restoring tissue flexibility helps normal motion to return to the joints. This allows healthier, less painful movement and activity. An effective stretch should be held for at least 30 seconds. A stretch should never be painful. You should only feel a gentle lengthening or release in the stretched tissue.  STRETCH  Gastroc, Standing  Place hands on wall. Extend right / left leg, keeping the front knee somewhat bent. Slightly point your toes inward on your back foot. Keeping your right / left heel on the floor and your knee straight, shift your weight toward the wall, not allowing your back to arch. You should feel a gentle stretch in the right / left calf. Hold this position for 10 seconds. Repeat 3 times. Complete this stretch 2 times per day.  STRETCH  Soleus, Standing  Place hands on wall. Extend right / left leg, keeping the other knee somewhat bent. Slightly point your toes inward on your back foot. Keep your right / left heel on the floor, bend your back knee, and slightly shift your weight over the back leg so that you feel a gentle stretch deep in your back calf. Hold this position for 10 seconds. Repeat 3 times. Complete this stretch 2 times per day.  STRETCH  Gastrocsoleus, Standing  Note: This exercise can place a lot of stress on your foot and ankle. Please  complete this exercise only if specifically instructed by your caregiver.  Place the ball of your right / left foot on a step, keeping your other foot firmly on the same step. Hold on to the wall or a rail for balance. Slowly lift your other foot, allowing your body weight to press your heel down over the edge of the step. You should feel a stretch in your right / left calf. Hold this position for 10 seconds. Repeat this exercise with a slight bend in your knee. Repeat 3 times. Complete this stretch 2 times per day.   STRENGTHENING EXERCISES - Achilles Tendinitis These exercises may help you when beginning to rehabilitate your injury. They may resolve your symptoms with or without further involvement from your physician, physical therapist or athletic trainer. While completing these exercises, remember:  Muscles can gain both the endurance and the strength needed for everyday activities through controlled exercises. Complete these exercises as instructed by your physician, physical therapist or athletic trainer. Progress the resistance and repetitions only as guided. You may experience muscle soreness or fatigue, but the pain or discomfort you are trying to eliminate should never worsen during these exercises. If this pain does worsen, stop and make certain you are following the directions exactly. If the pain is still present after adjustments, discontinue the exercise until you can discuss the trouble with your clinician.  STRENGTH - Plantar-flexors  Sit with your right / left leg extended. Holding onto both ends of a rubber exercise band/tubing, loop it around the  ball of your foot. Keep a slight tension in the band. Slowly push your toes away from you, pointing them downward. Hold this position for 10 seconds. Return slowly, controlling the tension in the band/tubing. Repeat 3 times. Complete this exercise 2 times per day.   STRENGTH - Plantar-flexors  Stand with your feet shoulder width  apart. Steady yourself with a wall or table using as little support as needed. Keeping your weight evenly spread over the width of your feet, rise up on your toes.* Hold this position for 10 seconds. Repeat 3 times. Complete this exercise 2 times per day.  *If this is too easy, shift your weight toward your right / left leg until you feel challenged. Ultimately, you may be asked to do this exercise with your right / left foot only.  STRENGTH  Plantar-flexors, Eccentric  Note: This exercise can place a lot of stress on your foot and ankle. Please complete this exercise only if specifically instructed by your caregiver.  Place the balls of your feet on a step. With your hands, use only enough support from a wall or rail to keep your balance. Keep your knees straight and rise up on your toes. Slowly shift your weight entirely to your right / left toes and pick up your opposite foot. Gently and with controlled movement, lower your weight through your right / left foot so that your heel drops below the level of the step. You will feel a slight stretch in the back of your calf at the end position. Use the healthy leg to help rise up onto the balls of both feet, then lower weight only on the right / left leg again. Build up to 15 repetitions. Then progress to 3 consecutive sets of 15 repetitions.* After completing the above exercise, complete the same exercise with a slight knee bend (about 30 degrees). Again, build up to 15 repetitions. Then progress to 3 consecutive sets of 15 repetitions.* Perform this exercise 2 times per day.  *When you easily complete 3 sets of 15, your physician, physical therapist or athletic trainer may advise you to add resistance by wearing a backpack filled with additional weight.  STRENGTH - Plantar Flexors, Seated  Sit on a chair that allows your feet to rest flat on the ground. If necessary, sit at the edge of the chair. Keeping your toes firmly on the ground, lift your  right / left heel as far as you can without increasing any discomfort in your ankle. Repeat 3 times. Complete this exercise 2 times a day.

## 2021-05-06 NOTE — Progress Notes (Signed)
Subjective: ?71 year old male presents the office today for further evaluation of nail fungus to both of his big toenails.  He has no pain associated with the toenails.  He still use the topical antifungal.  He is asked that the nail needs to be removed.  Denies any swelling or redness or any drainage for the nail sites.  No other concerns. ? ?Objective: ?AAO x3, NAD ?DP/PT pulses palpable bilaterally, CRT less than 3 seconds ?Bilateral hallux nails are hypertrophic, dystrophic with yellow, brown discoloration.  Right side is worse than left.  There is no edema, erythema or signs of infection.  No significant tenderness palpation on exam.  No hyperpigmentation.  No pain with calf compression, swelling, warmth, erythema ? ?Assessment: ?Onychodystrophy, onychomycosis ? ?Plan: ?-All treatment options discussed with the patient including all alternatives, risks, complications.  ?-Discussed with him nail removal.  However this point he still using the topical medicine.  There is been some slight improvement but overall still remains the same.  Already continue with the topical medication but if no improvement then we will remove the toenail as there is no guarantee of the nail coming back in will come and any more normal. ?-Patient encouraged to call the office with any questions, concerns, change in symptoms.  ? ?Trula Slade DPM ? ? ?

## 2021-08-08 ENCOUNTER — Ambulatory Visit: Payer: Medicare Other | Admitting: Podiatry

## 2021-08-20 DIAGNOSIS — G4733 Obstructive sleep apnea (adult) (pediatric): Secondary | ICD-10-CM | POA: Insufficient documentation

## 2021-08-26 ENCOUNTER — Ambulatory Visit: Payer: Medicare Other | Admitting: Podiatry

## 2021-09-09 ENCOUNTER — Ambulatory Visit (INDEPENDENT_AMBULATORY_CARE_PROVIDER_SITE_OTHER): Payer: Medicare Other | Admitting: Podiatry

## 2021-09-09 DIAGNOSIS — B351 Tinea unguium: Secondary | ICD-10-CM

## 2021-09-09 MED ORDER — EFINACONAZOLE 10 % EX SOLN: 1.0000 [drp] | Freq: Every day | CUTANEOUS | 11 refills | Status: DC

## 2021-09-09 NOTE — Patient Instructions (Signed)
You can also use "urea nail gel" on the toenail to help thin them

## 2021-09-12 DIAGNOSIS — B351 Tinea unguium: Secondary | ICD-10-CM | POA: Insufficient documentation

## 2021-09-12 NOTE — Progress Notes (Signed)
Subjective: 71 year old male presents the office today for further evaluation of nail fungus to both of his big toenails.  He states they were doing well but he is at a standstill now.  No pain with the nails and no swelling redness or any drainage.  He was using the topical medication.   Objective: AAO x3, NAD DP/PT pulses palpable bilaterally, CRT less than 3 seconds Bilateral hallux nails are hypertrophic, dystrophic with yellow, brown discoloration.  There is some clearing of proximal nail borders but overall mostly unchanged.  Right side is worse than left.  There is no edema, erythema or signs of infection.  No significant tenderness palpation on exam.  No hyperpigmentation.  No pain with calf compression, swelling, warmth, erythema  Assessment: Onychodystrophy, onychomycosis  Plan: -All treatment options discussed with the patient including all alternatives, risks, complications.  -Discussed with him nail removal as an option but would examine conservative care.  We will switch to using Jublia as well as urea nail gel. -Today we also lasered the toenail.  Following standard proper eyewear protection precautions the nail was lasered with any complications by the medical assistant.  Tolerated well. -Patient encouraged to call the office with any questions, concerns, change in symptoms.   Trula Slade DPM

## 2021-09-23 ENCOUNTER — Telehealth: Payer: Self-pay

## 2021-09-23 ENCOUNTER — Other Ambulatory Visit: Payer: Self-pay | Admitting: Podiatry

## 2021-09-23 ENCOUNTER — Telehealth: Payer: Self-pay | Admitting: Podiatry

## 2021-09-23 MED ORDER — TAVABOROLE 5 % EX SOLN: 1.0000 [drp] | Freq: Every day | CUTANEOUS | 2 refills | Status: DC

## 2021-09-23 NOTE — Telephone Encounter (Signed)
Noted, thanks!

## 2021-09-23 NOTE — Telephone Encounter (Signed)
Pt  Called about his Rx that hes been waiting on. [employer] said it looks like the Rx is stuck somewhere and needs to be sent again. Pt stated the first Rx was $800 and wanted a cheaper alternative.  Please advise

## 2021-12-10 ENCOUNTER — Ambulatory Visit (INDEPENDENT_AMBULATORY_CARE_PROVIDER_SITE_OTHER): Payer: Medicare Other | Admitting: Podiatry

## 2021-12-10 DIAGNOSIS — B351 Tinea unguium: Secondary | ICD-10-CM | POA: Diagnosis not present

## 2021-12-10 NOTE — Patient Instructions (Signed)
You can use "urea nail gel" on the toenails to help with the thickening and damage as well.

## 2021-12-10 NOTE — Progress Notes (Signed)
Subjective: No chief complaint on file.   70 year old male presents the office today for further evaluation of nail fungus to both of his big toenails.  No swelling redness or drainage.  Objective: AAO x3, NAD DP/PT pulses palpable bilaterally, CRT less than 3 seconds Bilateral hallux nails are hypertrophic, dystrophic with yellow, brown discoloration.  There is some clearing of proximal nail borders but overall mostly unchanged compared to prior.  Right side is worse than left.  There is no edema, erythema or signs of infection.  No significant tenderness palpation on exam.  No hyperpigmentation.  No pain with calf compression, swelling, warmth, erythema  Assessment: Onychodystrophy, onychomycosis  Plan: -All treatment options discussed with the patient including all alternatives, risks, complications.  -Sharply debrided nails and complications or bleeding.  Continue topical antifungal but also discussed adding urea nail gel to help with the thickening, damage to the nails. -Patient encouraged to call the office with any questions, concerns, change in symptoms.   Trula Slade DPM

## 2022-05-06 DIAGNOSIS — H903 Sensorineural hearing loss, bilateral: Secondary | ICD-10-CM | POA: Insufficient documentation

## 2023-04-29 ENCOUNTER — Other Ambulatory Visit: Payer: Self-pay | Admitting: Internal Medicine

## 2023-04-29 DIAGNOSIS — R251 Tremor, unspecified: Secondary | ICD-10-CM

## 2023-04-29 DIAGNOSIS — R5383 Other fatigue: Secondary | ICD-10-CM

## 2023-04-29 DIAGNOSIS — R413 Other amnesia: Secondary | ICD-10-CM

## 2023-05-04 ENCOUNTER — Ambulatory Visit
Admission: RE | Admit: 2023-05-04 | Discharge: 2023-05-04 | Disposition: A | Discharge status: Home / Self Care | Payer: Self-pay | Source: Ambulatory Visit | Attending: Internal Medicine | Admitting: Internal Medicine

## 2023-05-04 DIAGNOSIS — R413 Other amnesia: Secondary | ICD-10-CM

## 2023-05-04 DIAGNOSIS — R251 Tremor, unspecified: Secondary | ICD-10-CM

## 2023-05-04 DIAGNOSIS — R5383 Other fatigue: Secondary | ICD-10-CM

## 2023-06-11 ENCOUNTER — Other Ambulatory Visit: Payer: Self-pay | Admitting: Nurse Practitioner

## 2023-06-11 DIAGNOSIS — R1310 Dysphagia, unspecified: Secondary | ICD-10-CM

## 2023-06-23 ENCOUNTER — Ambulatory Visit
Admission: RE | Admit: 2023-06-23 | Discharge: 2023-06-23 | Disposition: A | Discharge status: Home / Self Care | Source: Ambulatory Visit | Attending: Nurse Practitioner | Admitting: Nurse Practitioner

## 2023-06-23 DIAGNOSIS — R1310 Dysphagia, unspecified: Secondary | ICD-10-CM

## 2023-09-02 ENCOUNTER — Ambulatory Visit (INDEPENDENT_AMBULATORY_CARE_PROVIDER_SITE_OTHER): Admitting: Psychiatry

## 2023-09-02 DIAGNOSIS — R69 Illness, unspecified: Secondary | ICD-10-CM

## 2023-09-02 DIAGNOSIS — F4323 Adjustment disorder with mixed anxiety and depressed mood: Secondary | ICD-10-CM

## 2023-09-02 DIAGNOSIS — G3184 Mild cognitive impairment, so stated: Secondary | ICD-10-CM | POA: Diagnosis not present

## 2023-09-02 NOTE — Progress Notes (Unsigned)
 PROBLEM-FOCUSED INITIAL PSYCHOTHERAPY EVALUATION David Kendall, PhD LP [facility]  Name: [name] Date: 09/02/2023 Time spent: 55 min MRN: [id number] DOB: 1950-03-27 Guardian/Payee: self  PCP: Pcp, No Documentation requested on this visit: No  PROBLEM HISTORY Reason for Visit /Presenting Problem:  Chief Complaint  Patient presents with   Establish Care   Anxiety   Other    Cognitive problem    Narrative/History of Present Illness 73yo male, originally [place], arrives 45 min early, alone.  Referred by ... can't recall who, another patient, most likely.  Identified 2 months now with dementia, unspecified.  Parkinson's runs in his family, and he's having hand tremors.  Dx from [facility].  Seeking a neurologist, appt coming with David Epp, MD this month.  Feels he's been falling apart the past year.  Noted word-finding problems, no complaint of memory per se, no hallucinatory experiences, no other neurological sxs noted.  Also recently dx'd heart condition, thinks it's a valve issue.  Started with a murmur, first cleared when lived in [facility], but now pursued further and referred to surgery, which takes some getting used to.  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  Was working with [employer], then transitioned to [employer], but couldn't handle the organizational demands.  Hard to face not having any job right now.  Sleep is average, interrupted briefly in the night once or twice.  Loves coffee, but stopped altogether about a month ago on medical recommendations.    Grew up in [place], [place], immigrated at [employer] to [address], by way of other [place] countries.  Feels throughout his adult life like he's been missing something, like his competitive edge.  Kids were rejecting initially to  this non-English speaker, not scary but marginalizing.  A 3rd culture kid, synthesizing the 2 cultures he comes from.  Feels his identity in [place] is sufficient, so he can continue but dismayed, feeling off again with the new diagnoses, health prospects, and retirement ahead of his wishes.  In high school in [place] , enjoyed surfing a good deal.  Personal support right now is [name], my super meryl, married 44 yrs.  Has one pastor friend, [name], he can open up with as well.  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   Prior Psychiatric Assessment/Treatment:   Outpatient treatment: none stated Psychiatric hospitalization: none stated Psychological assessment/testing: none stated   Abuse/neglect screening: Victim of abuse: Not assessed at this time / none suspected.   Victim of neglect: Not assessed at this time / none suspected.   Perpetrator of abuse/neglect: Not assessed at this time / none suspected.   Witness / Exposure to Domestic Violence: Not assessed at this time / none suspected.   Witness to Community Violence:  Not assessed at this time / none suspected.   Protective Services Involvement: No.   Report needed: No.    Substance abuse screening: Current substance abuse: Not assessed at this time / none suspected.   History of impactful substance use/abuse: Not assessed at this time / none suspected.     FAMILY/SOCIAL HISTORY Family of origin -- [place] origins, immigrated as a child Family of intention/current living situation -- [facility] nurse, longterm, stable Education -- Not assessed Vocation -- Tax adviser, involuntarily retired by  health conditions Finances -- Not assessed Spiritually -- Spiritual/religious identification Protestant.  Practicing: Yes Enjoyable activities -- deferred  Other situational factors affecting treatment and prognosis: Stressors from the following areas: Health problems and  Occupational concerns Barriers to service: availability  Notable cultural sensitivities: not applicable  Strengths: Supportive Relationships and Spirituality   MED/SURG HISTORY Med/surg history was partially reviewed with Pt at this time.  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  Recent need to adjust it, sometimes has been working too strongly.  PCP [name] with [facility].  Med list from outside sources highlights vitamin supplements and Namenda.  Was on fish oil but didn't think it was doing anything, stopped.  Past Medical History:  Diagnosis Date   Cancer (HCC)    larygneal 2003, ssq L eye used interferon gtts   Gout    OSA (obstructive sleep apnea)    has inspire 2023   SVT (supraventricular tachycardia) (HCC)    benign heart murmur     Past Surgical History:  Procedure Laterality Date   NO PAST SURGERIES      No Known Allergies  Medications (as listed in Epic): Current Outpatient Medications  Medication Sig Dispense Refill   Ergocalciferol 10 MCG (400 UNIT) TABS Take 1 tablet by mouth.     memantine (NAMENDA) 10 MG tablet Take 10 mg by mouth 2 (two) times daily.     pantoprazole (PROTONIX) 40 MG tablet Take 40 mg by mouth daily.     No current facility-administered medications for this visit.   Is on Namenda, as noted, and pantoprazole for dysphagia.   MENTAL STATUS AND OBSERVATIONS Appearance:   Casual and Neat     Behavior:  Appropriate  Motor:  Mild tremor  Speech/Language:   Clear and Coherent and husky  Affect:  Appropriate and slight delay  Mood:  generally euthymic, anxious with subjects  Thought process:  normal and mild word-finding  Thought content:    WNL  Sensory/Perceptual disturbances:    WNL  Orientation:  Fully oriented  Attention:  Good  Concentration:  Good  Memory:  WNL  Fund of  knowledge:   Good  Insight:    Good  Judgment:   Good  Impulse Control:  Good   Initial Risk Assessment: Danger to self: No Self-injurious behavior: No Danger to others: No Physical aggression / violence: No Duty to warn: No Access to firearms a concern: No Gang involvement or other high risk behavior: No Patient / guardian was educated about steps to take if suicide or homicide risk level increases between visits: yes While future psychiatric events cannot be accurately predicted, the patient does not currently require acute inpatient psychiatric care and does not currently meet [facility]  involuntary commitment criteria.   DIAGNOSIS:    ICD-10-CM   1. Adjustment disorder with mixed anxiety and depressed mood  F43.23     2. Mild cognitive impairment  G31.84     3. r/o dysthymia, early onset  R69       INITIAL TREATMENT: Support/validation provided for distressing symptoms and confirmed rapport Ethical orientation and informed consent confirmed re: Privacy rights -- including but not limited to HIPAA, EMR and use of e-PHI Working alliance -- expectations for working relationship in psychotherapy, initial orientation to cognitive-behavioral and solution-focused therapy approach Patient responsibilities -- scheduling, fair notice of changes, in-person vs. telehealth  and regulatory and financial conditions affecting choice Coordination of care -- needs and consents for working partnerships and exchange of information with other health care providers, especially any medication and other behavioral health providers Outlook for therapy -- scheduling constraints, availability of crisis service, inclusion of family member(s) as appropriate Psychoeducation and initial therapy: Noted a life of substantial adjustments before, hardships weathered before, and validated permission to be shaken again by life changes even after hopefully passing hard phases in life Endorsed personal support  system Endorsed vitamin D, B complex, and omega 3 for nutritional brain support and recent decaffeination if medically indicated.  If not so important for heart reasons, it should be OK to allow modest caffeine back in first half of the day. Support obtaining needed services, including surgery, as energy and alertness benefits have been reported from corrected valves and reducing clot risk  Plan: Support -- Maintain personal support and spiritual involvement as able Lifestyle -- Maintain special and physical activity up to any medial limits Health concerns -- Ensure hypoglossal stimulator is in good adjustment to maintain airway.  Will discuss and prepare for procedures, forecast as needed what experiences to look for.  May look into ideas for cognitive stimulation and assisting.  Option to ask about neuro rehab or OT referral through neurology.   Maintain medication as prescribed and work faithfully with relevant prescriber(s) if any changes are desired or seem indicated Call the clinic on-call service, present to ER, or call 911 if any life-threatening psychiatric crisis Return in about 2 weeks (around 09/16/2023) for recommend sched ahead.  David Kendall, PhD  David Kendall, PhD LP Clinical Psychologist, [facility] [address] [place], [place] [address] [phone]

## 2023-09-21 ENCOUNTER — Telehealth: Payer: Self-pay | Admitting: Neurology

## 2023-09-21 NOTE — Telephone Encounter (Signed)
 Patient called to confirm appointment.

## 2023-09-23 ENCOUNTER — Telehealth: Payer: Self-pay | Admitting: Neurology

## 2023-09-23 ENCOUNTER — Encounter: Payer: Self-pay | Admitting: Neurology

## 2023-09-23 ENCOUNTER — Ambulatory Visit: Admitting: Neurology

## 2023-09-23 VITALS — BP 119/74 | HR 74 | Ht 72.0 in | Wt 164.0 lb

## 2023-09-23 DIAGNOSIS — G3184 Mild cognitive impairment, so stated: Secondary | ICD-10-CM | POA: Diagnosis not present

## 2023-09-23 DIAGNOSIS — R251 Tremor, unspecified: Secondary | ICD-10-CM | POA: Diagnosis not present

## 2023-09-23 DIAGNOSIS — R29898 Other symptoms and signs involving the musculoskeletal system: Secondary | ICD-10-CM

## 2023-09-23 DIAGNOSIS — R29818 Other symptoms and signs involving the nervous system: Secondary | ICD-10-CM | POA: Diagnosis not present

## 2023-09-23 DIAGNOSIS — R4189 Other symptoms and signs involving cognitive functions and awareness: Secondary | ICD-10-CM

## 2023-09-23 DIAGNOSIS — R269 Unspecified abnormalities of gait and mobility: Secondary | ICD-10-CM

## 2023-09-23 NOTE — Telephone Encounter (Signed)
 no auth required sent to [facility] [phone]

## 2023-09-23 NOTE — Progress Notes (Signed)
 [facility]    Provider:  Dr Ines Requesting Provider: Verdia Lombard, MD Primary Care Provider:  Pcp, No  CC: Alzheimer's dementia  HPI:  [name] is a 73 y.o. male here as requested by Verdia Lombard, MD for Alzheimer's dementia.  I reviewed our wonderful colleague's notes, Dr. Verdia, and patient has a diagnosis of Alzheimer's disease, without behavioral disturbances psychotic disturbance mood disorders and anxiety, tremor and esophageal dysphagia.  At last appointment in March of this year he was started on memantine.  He also complained of dysphagia for more than a year occurring with solids and not liquids and has been evaluated very thoroughly by his ENT.  Of note, he also has obstructive sleep apnea diagnosed approximately 15 years ago he was prescribed a CPAP however he could not tolerate, after SVT was identified he was again referred to a pulmonologist and his AHI was found to be greater than 100 per patient's wife, he failed all attempts at CPAP and was ultimately treated with inspire device and follows with his pulmonologist for evaluation of this therapy.  As far as his memory, gradually worsening over 1 to 2 years, manifesting in the form of word finding.   Here with his wife who provides much information. Started about 3 years ago. He was falling asleep and they found out he had severe sleep apnea. He has severe stenotic stenosis. He started by feeling he coul dnot hold onto things, he exercise moderately not as stressful, he is a Education officer, environmental, slowly progressive, he remembers things his wife does not, he has never had a math mind and so the calculations may be representative(never able to do calculations). 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 No hallucinations, delusions, changes in behavior. Not repeating the same  things, he does't forget that it is christmas or easter, he still drives, more the syptoms are expressive speech, getting things out, hard time finding the words, forgetting what he is talking about but eventually may remember if he waits a bit can get the thought out. He states he has difficulty with receptive speech, he has to ask people to repeat things more often. He does ave difficulty with complex directions.He is responsible for vaccuuming the house and never forgets. Wife has always paid the bills because he is bad with money(always has been). He quotes portions of the bible. Had an uncle with parkinson's disease. They don;t know about his mother's side as she died at the age of 10 and the rest are still in [place]. Not easily distracted. No shuffling. He has tremors in his right hand with an extensive family history of tremor in the right hand when trying to write, eating soup but not at rest. Balance is great. NO rem sleep disorder, he will get up and go to the bathroom. 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 He has never been very animated but since dementia possibility he is more frozen. After the inspire he has AHI went from 6 to 100 but when he is on his back he has hypopneas and now he sleeps on his side.Tremor started worsening 3-4 onths ago but writing.    Reviewed notes, labs and imaging from outside physicians, which showed:    Reviewed images of CT head and agree with  below EXAM: CT HEAD WITHOUT CONTRAST   TECHNIQUE: Contiguous axial images were obtained from the base of the skull through the vertex without intravenous contrast.   RADIATION DOSE REDUCTION: This exam was performed according to the departmental dose-optimization program which includes automated exposure control, adjustment of the mA and/or kV according to patient size and/or use of iterative reconstruction technique.   COMPARISON:  None Available.   FINDINGS: Brain:  No midline shift, ventriculomegaly, mass effect, evidence of mass lesion, intracranial hemorrhage or evidence of cortically based acute infarction. Gray-white differentiation is symmetric and within normal limits for age. Mesial temporal lobes appear within normal limits for age.   Vascular: Calcified atherosclerosis at the skull base. No suspicious intracranial vascular hyperdensity.   Skull: Intact, within normal limits for age.   Sinuses/Orbits: Mild to moderate left ethmoid sinus mucosal thickening and opacification. Minimal other paranasal sinus mucosal thickening. Tympanic cavities and mastoids appear clear.   Other: Visualized orbits and scalp soft tissues are within normal limits. Negative visible nasopharynx.   IMPRESSION: 1. Normal for age noncontrast CT appearance of the Brain. 2. Mild to moderate paranasal sinus inflammation, primarily in the left ethmoid  Labs included on April 29, 2023: RPR nonreactive, CBC normal, B12 673 normal, folate normal, CMP with AST of 90 which is elevated otherwise normal with BUN 13 and creatinine 0.81, TSH normal 1.92.  Review of Systems: Patient complains of symptoms per HPI as well as the following symptoms per hpi. Pertinent negatives and positives per HPI. All others negative.   Social History   Socioeconomic History   Marital status: Married    Spouse name: Not on file   Number of children: Not on file   Years of education: Not on file   Highest education level: Not on file  Occupational History   Not on file  Tobacco Use   Smoking status: Never   Smokeless tobacco: Not on file  Substance and Sexual Activity   Alcohol use: No   Drug use: No   Sexual activity: Not on file  Other Topics Concern   Not on file  Social History Narrative   Not on file   Social Drivers of Health   Financial Resource Strain: Not on file  Food Insecurity: Not on file  Transportation Needs: Not on file  Physical Activity: Not on file   Stress: Not on file  Social Connections: Not on file  Intimate Partner Violence: Not on file    Family History  Problem Relation Age of Onset   Parkinson's disease Paternal Uncle     Past Medical History:  Diagnosis Date   Cancer (HCC)    larygneal 2003, ssq L eye used interferon gtts   Gout    OSA (obstructive sleep apnea)    has inspire 2023   SVT (supraventricular tachycardia) (HCC)    benign heart murmur    Patient Active Problem List   Diagnosis Date Noted   Mild cognitive impairment 09/27/2023   Tremor 09/27/2023   Onychomycosis 09/12/2021    Past Surgical History:  Procedure Laterality Date   NO PAST SURGERIES      Current Outpatient Medications  Medication Sig Dispense Refill   Ergocalciferol 10 MCG (400 UNIT) TABS Take 1 tablet by mouth.     memantine (NAMENDA) 10 MG tablet Take 10 mg by mouth 2 (two) times daily.     pantoprazole (PROTONIX) 40 MG tablet Take 40 mg by mouth daily.     No current facility-administered medications for  this visit.    Allergies as of 09/23/2023   (No Known Allergies)    Vitals: BP 119/74 (Cuff Size: Normal)   Pulse 74   Ht 6' (1.829 m)   Wt 164 lb (74.4 kg)   BMI 22.24 kg/m  Last Weight:  Wt Readings from Last 1 Encounters:  09/23/23 164 lb (74.4 kg)   Last Height:   Ht Readings from Last 1 Encounters:  09/23/23 6' (1.829 m)     Physical exam: Exam: Gen: NAD, conversant, well nourised, well groomed                     CV: RRR, no MRG. No Carotid Bruits. No peripheral edema, warm, nontender Eyes: Conjunctivae clear without exudates or hemorrhage  Neuro: Detailed Neurologic Exam  Speech:    Speech is normal; fluent and spontaneous with normal comprehension.  Cognition:     09/23/2023   11:50 AM  MMSE - Mini Mental State Exam  Orientation to time 4  Orientation to Place 5  Registration 3  Attention/ Calculation 4  Recall 1  Language- name 2 objects 2  Language- repeat 1  Language- follow 3 step  command 3  Language- read & follow direction 1  Write a sentence 1  Copy design 0  Total score 25     Cranial Nerves: Hypomimia    The pupils are equal, round, and reactive to light. The fundi are flat. Visual fields are full to finger confrontation. Extraocular movements are intact. Trigeminal sensation is intact and the muscles of mastication are normal. The face is symmetric. The palate elevates in the midline. Hearing intact. Voice is normal. Shoulder shrug is normal. The tongue has normal motion without fasciculations.   Coordination: Difficulty with finger taps right hand  Gait:    Heel-toe gait normal.   Motor Observation:    No asymmetry, no atrophy,  right hand tremore more postural and with action Tone: Cogwheeling right arm, increased tone right leg  Posture:    Posture is normal. normal erect    Strength:    Strength is V/V in the upper and lower limbs.      Sensation: intact to LT     Reflex Exam:  DTR's:    Deep tendon reflexes in the upper and lower extremities are brisk bilatrally  bilaterally.   Toes:    The toes are downgoing bilaterally.   Clonus:    Clonus is absent.    Assessment/Plan:  73 y.o. male here as requested by Verdia Lombard, MD for Alzheimer's dementia.  I reviewed our wonderful colleague's notes, Dr. Verdia, and patient has a diagnosis of Alzheimer's disease, without behavioral disturbances psychotic disturbance mood disorders and anxiety, tremor, sleep apnea (AHI > 100 by report) failed cpap and follow with Pulmonology with inspire device, and esophageal dysphagia.  At last appointment in March of this year he was started on memantine.  As far as his memory, gradually worsening over 1 to 2 years, manifesting in the form of word finding per patient  MRI of the brain for reversible causes of dementia DAT Scan: Parkinsinian features, concerning for idiopathic parkinson's disease vs esential tremor(family hx of tremor) Formal  Neurocognitive testing Biomarkers of alzheimer's disease  in blood and then can consider CT PET Amyloid Discussed treatment options, for follow up  Orders Placed This Encounter  Procedures   MR BRAIN W WO CONTRAST   NM BRAIN DATSCAN TUMOR LOC INFLAM SPECT 1 DAY   Vitamin B1  ATN PROFILE   APOE Alzheimer's Risk   Comprehensive metabolic panel with GFR   No orders of the defined types were placed in this encounter.   Cc: Verdia Lombard, MD,  Pcp, No  Onetha Epp, MD  [facility] [address] [place], [place] [address]  Phone [phone] Fax [phone]

## 2023-09-23 NOTE — Patient Instructions (Addendum)
 PLAN MRI of the brain w/wo contrast Formal Neurocognitive testing Biomarkers of alzheimer's disease  in blood DAT Scan for parkinson's disease  Other tests in the future if needed include: Would consider  a CT PET Amyloid Scan after above based on findings as this can visualize the amount of amyloid alzheimer's proteins in the brain(if proteins in the blood) Other medications: You are on Memantine and other medications are Aricept, Donanemab and Lecanemab After this we do have other tests to look for lewy-body dementia or frontotemporal dementia.   Brain DaTscan How to prepare and what to expect What is a brain DaTscan? A brain DaTscan is a nuclear medicine scan. It uses radioactive material to diagnose some diseases of the brain, especially those that cause tremor (shakiness). DaTscan is a brand name for a drug called ioflupane I-123. A brain DaTscan is a form of radiology, because radiation is used to take pictures of the body. This radioactive drug is ordered especially for you. Because of this, we need at least 72 hours' notice if you must cancel or reschedule your scan.   How does the scan work? You will be given a small dose of tracer (radioactive material) through an intravenous (IV) line. This tracer will collect in part of your brain and give off gamma rays. A special camera called a gamma camera will use these rays to produce pictures and measurements of your brain. How do I prepare? Some drugs will affect the results of your brain DaTscan. You will need to stop taking these drugs before your scan. The table on page 2 lists the drugs that need to be stopped, and for how many days before your scan. This list is in alphabetical order by the generic name of the drug. The common brand names are listed beneath the generic name. Please confirm these instructions with your doctor who prescribed the drug. Drugs to Stop Taking Before your scan, stop taking these medicines for the length of  time shown: Name of Drug Stop Taking  Amoxapine 4 days before  Benztropine  Cogentin 3 days before  Bupropion (Aplenzin, Budeprion, Voxra, Wellbutrin, Zyban) 48 hours before  Buspirone 15 hours before  Citalopram 24 hours before  Cocaine 6 hours before  Escitalopram 24 hours before  Methamphetamine 24 hours before  Methylphenidate (Concerta, Metadate, Methylin, Ritalin) 20 hours before  Paroxetine 24 hours before  Selegilene 48 hours before  Sertraline 3 days before  If you are breastfeeding, or if there is any chance you are pregnant, please tell the scheduler or technologist (the person who will help you prepare for your scan). How is the scan done? When you first arrive, we will ask you to drink a small cup of water with potassium iodine in it. This water may have a metallic taste.  An hour after you drink the potassium iodine water, the technologist will inject a small amount of tracer into a vein in your arm or hand through your IV.  You must stay in the department for 30 minutes after the injection.  You will then have a break for 3 hours. It is OK to eat and drink during this break.  You must return to the clinic after this 3-hour break to have images of your brain taken.  Then, 4 hours after you receive your tracer injection, the technologist will take images of your brain with the gamma camera. You will lie flat on the exam table while these images are being taken.  You must not move  while the camera is taking pictures. If you move, the pictures will be blurry and may have to be taken again.  Taking the images will take 40 to 45 minutes. Your total time in the imaging room will be about 1 hour.  You may also have a low-dose CT scan of your brain to help confirm any results. A CT scan is another way to take images inside your body.  It will take about 5 hours from the time you drink the potassium iodine water until the scans are complete. What will I feel during the scan? The  technologist will help make you as comfortable as possible on the exam table for the scan.  You may feel some minor discomfort from the IV.  Lying still on the exam table may be hard for some patients.  The camera will be close to your head. This may make you feel confined or uneasy (claustrophobic). Please tell the doctor who referred you for this scan if you know you are claustrophobic. Are there any side effects from the scan? Most of the radioactivity from the tracer will pass out of your body in your urine or stool. The rest simply goes away over time.  Bad reactions to this scan are very rare. Fewer than 1% of patients (fewer than 1 out of 100) have a bad reaction. Reactions may include headache, nausea, vertigo (dizziness), or dry mouth. How do I get the results? When the test is over, the nuclear medicine doctor will review your images, prepare a written report, and talk with your doctor about the results. Your doctor will then talk with you about the results and your treatment options. If you needed to stop taking any medicines on the day of your scan, ask your doctor when to start taking them again.  The potentially interfering drugs consist of: amoxapine, amphetamine, benztropine, bupropion, buspirone, citalopram, cocaine, mazindol, methamphetamine, methylphenidate, norephedrine, phentermine, escitalopram,  phenylpropanolamine, selegiline, paroxetine, and sertraline

## 2023-09-27 ENCOUNTER — Encounter: Payer: Self-pay | Admitting: Neurology

## 2023-09-27 DIAGNOSIS — R251 Tremor, unspecified: Secondary | ICD-10-CM | POA: Insufficient documentation

## 2023-09-27 DIAGNOSIS — G3184 Mild cognitive impairment, so stated: Secondary | ICD-10-CM | POA: Insufficient documentation

## 2023-09-29 ENCOUNTER — Ambulatory Visit: Payer: Self-pay | Admitting: Neurology

## 2023-10-01 ENCOUNTER — Ambulatory Visit (INDEPENDENT_AMBULATORY_CARE_PROVIDER_SITE_OTHER): Admitting: Psychiatry

## 2023-10-01 DIAGNOSIS — R69 Illness, unspecified: Secondary | ICD-10-CM | POA: Diagnosis not present

## 2023-10-01 DIAGNOSIS — G3184 Mild cognitive impairment, so stated: Secondary | ICD-10-CM

## 2023-10-01 DIAGNOSIS — F4323 Adjustment disorder with mixed anxiety and depressed mood: Secondary | ICD-10-CM

## 2023-10-01 DIAGNOSIS — G4733 Obstructive sleep apnea (adult) (pediatric): Secondary | ICD-10-CM | POA: Diagnosis not present

## 2023-10-01 NOTE — Progress Notes (Unsigned)
 Psychotherapy Progress Note [facility] [name], PhD LP  Patient ID: [name] [facility])    MRN: [id number] Therapy format: Individual psychotherapy Date: 10/01/2023      Start: 2:07p     Stop: 2:57p     Time Spent: 50 min Location: In-person   Session narrative (presenting needs, interim history, self-report of stressors and symptoms, applications of prior therapy, status changes, and interventions made in session) Has seen Dr. Ines, neurology, and partial results are in.  Positive blood markers for Alzheimer's, but other results needed.  According th EHR, awaiting genetic testing, MRI to come on 8/19.  Meanwhile, cardiology Sept 30, to evaluate heart condition that has meant restraining physical activity till further notice and talk of a possible valve replacement.  More bothered by that, but also unsure what to expect with MRI.  Discussed typical experience with MRI, pt aware his will be several hours' visit, in 2 parts (contrast and w/o contrsast, with a washout period?).  Determined he is not likely to experience claustrophobia and commended to the procedure with his own coping repertoire.   Also will be having a GI followup at [facility] sometime soon. Honestly, says it's saddening to bet old he has 2 challenging and/or degenerative diseases and there are all these things he may have to gvie up or do differently, but says he is maintaining a firm faith.  Endorsed permission to be disappointed then do as he is doing, moving from Why? To what and how managing his altered health.  Various concerns noted and dealt with.  One that his Inspire device may spoil MRI -- looked up online and found that his model (3028) is MRI safe.  Feels medication he is on may make him more subdued, but not sure anything on his short list would (pantoprazole, memantine, D3).  Apparent there is a difference of opinion between PCP and neuro about memnatine -- PCP went ahead and diagnosed Alz  Dz and started the med, but reportedly neurology may want him off of it, for reasons gabe cannot say.  Best guess it's a matter of clarifying diagnosis to see if memantine is the right call or possibly a mismatch to some other neurological condition he has.     , calling it Alzheimer's and starting memantine, whereas neurologist allegedly wants him to wean off.    Has encountered cancer 3x.  Laryngeal CA treatment (stage 1) took some of the power of his voice, left him soft-spoken.     Turned attention to intentions for living the ex 3 weeks.  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  Looking forward to the food and seeing downtown, and maybe a heart to heart with Ozell (who is a Naval architect grad).    Therapeutic modalities: {AM:23362::Cognitive Behavioral Therapy,Solution-Oriented/Positive Psychology}  Mental Status/Observations:  Appearance:   {PSY:22683}     Behavior:  {PSY:21022743}  Motor:  {PSY:22302}  Speech/Language:   {PSY:22685}  Affect:  {PSY:22687}  Mood:  {PSY:31886}  Thought process:  {PSY:31888}  Thought content:    {PSY:[phone]}  Sensory/Perceptual disturbances:    {PSY:[phone]}  Orientation:  {Psych Orientation:23301::Fully oriented}  Attention:  {Good-Fair-Poor ratings:23770::Good}    Concentration:  {Good-Fair-Poor ratings:23770::Good}  Memory:  {[phone]}  Insight:    {Good-Fair-Poor ratings:23770::Good}  Judgment:   {Good-Fair-Poor ratings:23770::Good}  Impulse Control:  {Good-Fair-Poor ratings:23770::Good}   Risk Assessment: Danger to Self: {Risk:22599::No} Self-injurious Behavior: {Risk:22599::No} Danger to Others: {Risk:22599::No} Physical Aggression / Violence: {Risk:22599::No} Duty  to Warn: {AMYesNo:22526::No} Access to Firearms a concern: {AMYesNo:22526::No}  Assessment of progress:  {Progress:22147::progressing}  Diagnosis:   ICD-10-CM   1. Adjustment disorder with  mixed anxiety and depressed mood  F43.23     2. Mild cognitive impairment  G31.84    r/o dementia -- doagnostics in progress with neurology    3. OSA (obstructive sleep apnea) with hypoglossal stimulator  G47.33      Plan:   Other recommendations/advice -- As may be noted above.  Continue to utilize previously learned skills ad lib. Medication compliance -- Maintain medication as prescribed and work faithfully with relevant prescriber(s) if any changes are desired or seem indicated. Crisis service -- Aware of call list and work-in appts.  Call the clinic on-call service, 988/hotline, 911, or present to [facility] or ER if any life-threatening psychiatric crisis. Followup -- Return for put on CA list, recommend sched ahead, may bring family.  Next scheduled visit with me Visit date not found.  Next scheduled in this office Visit date not found.  [name], PhD [name], PhD LP Clinical Psychologist, [facility] [address], [address] [place], [place] [address] [phone]

## 2023-10-02 LAB — COMPREHENSIVE METABOLIC PANEL WITH GFR
ALT: 16 IU/L (ref 0–44)
AST: 28 IU/L (ref 0–40)
Albumin: 4.3 g/dL (ref 3.8–4.8)
Alkaline Phosphatase: 75 IU/L (ref 44–121)
BUN/Creatinine Ratio: 14 (ref 10–24)
BUN: 12 mg/dL (ref 8–27)
Bilirubin Total: 1 mg/dL (ref 0.0–1.2)
CO2: 25 mmol/L (ref 20–29)
Calcium: 9.6 mg/dL (ref 8.6–10.2)
Chloride: 102 mmol/L (ref 96–106)
Creatinine, Ser: 0.85 mg/dL (ref 0.76–1.27)
Globulin, Total: 2.3 g/dL (ref 1.5–4.5)
Glucose: 89 mg/dL (ref 70–99)
Potassium: 4.5 mmol/L (ref 3.5–5.2)
Sodium: 143 mmol/L (ref 134–144)
Total Protein: 6.6 g/dL (ref 6.0–8.5)
eGFR: 93 mL/min/1.73 (ref >59)

## 2023-10-02 LAB — ATN PROFILE
A -- Beta-amyloid 42/40 Ratio: 0.099 — AB (ref >0.102)
Beta-amyloid 40: 203.11 pg/mL
Beta-amyloid 42: 20.03 pg/mL
N -- NfL, Plasma: 2.19 pg/mL (ref 0.00–6.04)
T -- p-tau181: 0.98 pg/mL — AB (ref 0.00–0.97)

## 2023-10-02 LAB — VITAMIN B1: Thiamine: 109.5 nmol/L (ref 66.5–200.0)

## 2023-10-02 LAB — APOE ALZHEIMER'S RISK

## 2023-10-19 ENCOUNTER — Ambulatory Visit (HOSPITAL_COMMUNITY)
Admission: RE | Admit: 2023-10-19 | Discharge: 2023-10-19 | Disposition: A | Discharge status: Home / Self Care | Source: Ambulatory Visit | Attending: Neurology | Admitting: Neurology

## 2023-10-19 DIAGNOSIS — R29898 Other symptoms and signs involving the musculoskeletal system: Secondary | ICD-10-CM | POA: Diagnosis present

## 2023-10-19 DIAGNOSIS — R251 Tremor, unspecified: Secondary | ICD-10-CM | POA: Diagnosis present

## 2023-10-19 DIAGNOSIS — G3184 Mild cognitive impairment, so stated: Secondary | ICD-10-CM

## 2023-10-19 DIAGNOSIS — R4189 Other symptoms and signs involving cognitive functions and awareness: Secondary | ICD-10-CM | POA: Diagnosis present

## 2023-10-19 DIAGNOSIS — R269 Unspecified abnormalities of gait and mobility: Secondary | ICD-10-CM | POA: Insufficient documentation

## 2023-10-19 DIAGNOSIS — R29818 Other symptoms and signs involving the nervous system: Secondary | ICD-10-CM | POA: Diagnosis present

## 2023-10-19 MED ORDER — GADOBUTROL 1 MMOL/ML IV SOLN
7.0000 mL | Freq: Once | INTRAVENOUS | Status: AC | PRN
  Administered 2023-10-19: 7 mL via INTRAVENOUS

## 2023-10-20 ENCOUNTER — Encounter (HOSPITAL_COMMUNITY)
Admission: RE | Admit: 2023-10-20 | Discharge: 2023-10-20 | Disposition: A | Discharge status: Home / Self Care | Source: Ambulatory Visit | Attending: Neurology | Admitting: Neurology

## 2023-10-20 DIAGNOSIS — R251 Tremor, unspecified: Secondary | ICD-10-CM | POA: Insufficient documentation

## 2023-10-20 DIAGNOSIS — G3184 Mild cognitive impairment, so stated: Secondary | ICD-10-CM | POA: Insufficient documentation

## 2023-10-20 DIAGNOSIS — R4189 Other symptoms and signs involving cognitive functions and awareness: Secondary | ICD-10-CM | POA: Diagnosis present

## 2023-10-20 DIAGNOSIS — R29898 Other symptoms and signs involving the musculoskeletal system: Secondary | ICD-10-CM | POA: Diagnosis present

## 2023-10-20 DIAGNOSIS — R269 Unspecified abnormalities of gait and mobility: Secondary | ICD-10-CM | POA: Insufficient documentation

## 2023-10-20 DIAGNOSIS — R29818 Other symptoms and signs involving the nervous system: Secondary | ICD-10-CM | POA: Insufficient documentation

## 2023-10-20 MED ORDER — POTASSIUM IODIDE (ANTIDOTE) 130 MG PO TABS
ORAL_TABLET | ORAL | Status: AC
  Filled 2023-10-20: qty 1

## 2023-10-20 MED ORDER — IOFLUPANE I 123 185 MBQ/2.5ML IV SOLN
4.7800 | Freq: Once | INTRAVENOUS | Status: AC | PRN
  Administered 2023-10-20: 4.78 via INTRAVENOUS

## 2023-10-26 NOTE — Addendum Note (Signed)
 Addended by: Amoura Ransier B on: 10/26/2023 10:12 AM   Modules accepted: Orders

## 2023-11-09 ENCOUNTER — Ambulatory Visit: Admitting: Psychiatry

## 2023-11-09 DIAGNOSIS — R69 Illness, unspecified: Secondary | ICD-10-CM

## 2023-11-09 DIAGNOSIS — F4323 Adjustment disorder with mixed anxiety and depressed mood: Secondary | ICD-10-CM

## 2023-11-09 DIAGNOSIS — G3184 Mild cognitive impairment, so stated: Secondary | ICD-10-CM

## 2023-11-09 NOTE — Progress Notes (Unsigned)
 Psychotherapy Progress Note [facility] David Kendall, PhD LP  Patient ID: [name] [facility])    MRN: [id number] Therapy format: Family therapy w/ patient -- accompanied by [name] Date: 11/09/2023      Start: 2:03p     Stop: 2:53p     Time Spent: 50 min Location: In-person   Session narrative (presenting needs, interim history, self-report of stressors and symptoms, applications of prior therapy, status changes, and interventions made in session) As of last visit, apprehensive about MRI, including compatibility of hypoglossal device, medication SE, and emerging risk of dementia.    Here with Berger of 45 yrs, who refers to [facility] as neuro spicy.  Main issues are apprehension with heart surgery frustrating his usual activity level.  Enjoyed trip to [place]  with family.    Brain MRI normal.  DAT scan showing evidence of Parkinson's.  Formal neuropsych is being scheduled.  Doubtful of LBD.   Tremor and family h/o Parkinson's suggest PD, as do other items like swallowing issues.  Is in self-administered PT using PBS videos ([name]).  Working with frequent small meals, in view of swallowing issues and losing weight.  Uncertain if need to dice some of his food.  Main stress is for TAVR surgery for severe aortic stenosis and restricted activity until then.  1st consultation 9/30, but quandaries about what is OK to do, have advice from cardiologist son to avoid anything strenuous, but not sure how careful to be.  Recommend consult cardiologist son again to clarify what is and is not strenuous.  Otherwise, go by feelings of fatigue or heaviness in chest to stop, and feel free to walk, stretch, yoga, tai chi, or water walking.  Emotionally, the main stress is not being himself, [name].  Always been a bit melancholy, closer to depressed the last couple years.  Cognitive problems began with severe sleep apnea (193 AHI), that CPAP failed to treat, but Inspire and  then side sleeping (enforced by placing a ball behind his back).  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  [name] herself is active in [facility] [place] [facility] 5 mornings.  Maybe [name] could tag along.  Voice and articulation, and word-finding, might be issues enough to prevent volunteering but not sure.  Offered [name], either for connections or volunteering.  At home, he has begun to pick up a few household chores, like vacuuming, and apprenticing at dusting.  Validated that one serious challenge, even without all the medical uncertainty, is creating lifestyle in retirement when one is so used to a professional role.  Therapeutic modalities: Cognitive Behavioral Therapy, Solution-Oriented/Positive Psychology, Environmental manager, and Faith-sensitive  Mental Status/Observations:  Appearance:   Casual     Behavior:  Appropriate  Motor:  Normal  Speech/Language:   Clear and Coherent and hesitating at times  Affect:  Appropriate  Mood:  anxious  Thought process:  Episodic blocking  Thought content:    WNL  Sensory/Perceptual disturbances:    WNL  Orientation:  Fully oriented  Attention:  Good    Concentration:  Good  Memory:  WNL  Insight:    Good  Judgment:   Good  Impulse Control:  Good   Risk Assessment: Danger to Self: No Self-injurious Behavior: No Danger to Others: No Physical Aggression / Violence: No Duty to Warn: No Access to Firearms a concern: No  Assessment of progress:  progressing  Diagnosis:   ICD-10-CM   1. Adjustment disorder with mixed anxiety and depressed mood  F43.23     2. 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 r/o dysthymia, early onset  R69      Plan:  Mood -- Practice permission to be grieved by hardships, then pivot to available actions, and pivot from why to what/how questions in addressing them.  Maintain personal support and spiritual involvement as able.  Endorse opening up to adult children as well.  Available for [name] to join therapy if she sees fit, for further problem-solving and behavioral consultation adjusting to illness and treatment.   Lifestyle -- Maintain special and physical activity up to any medical limits Cognitive -- Defer assessment to neurology and potential neuropsychology if indicated.  Endorse continuing Namenda or appropriate alternative, and maintaining and/or strengthening supplementation with relevant B vitamins, D, and omega 3.  May look into ideas for cognitive stimulation and exercising executive function, word-finding, and any other affected faculties.  Option to ask about neuropsych testing and about neuro rehab or OT referral through neurology if interested in specialty care for cognitive issues. Other health concerns -- Ensure hypoglossal stimulator is in good adjustment to maintain airway.  Will discuss and prepare for procedures, forecast as needed what experiences to look for.   Other recommendations/advice -- As may be noted above.  Continue to utilize previously learned skills ad lib. Medication compliance -- Maintain medication as prescribed and work faithfully with relevant prescriber(s) if any changes are desired or seem indicated. Crisis service -- Aware of call list and work-in appts.  Call the clinic on-call service, 988/hotline, 911, or present to [facility] or ER if any life-threatening psychiatric crisis. Followup -- Return for time as already scheduled.  Next scheduled visit with me 12/10/2023.  Next scheduled in this office 12/10/2023.  David Kendall, PhD David Kendall, PhD LP Clinical  Psychologist, [facility] [address], [address] [place], [place] [address] [phone]

## 2023-11-09 NOTE — Progress Notes (Incomplete)
 Psychotherapy Progress Note [facility] [name], PhD LP  Patient ID: [name] [facility])    MRN: [id number] Therapy format: Family therapy w/ patient -- accompanied by [name] Date: 11/09/2023      Start: 2:03p     Stop: 2:53p     Time Spent: 50 min Location: In-person   Session narrative (presenting needs, interim history, self-report of stressors and symptoms, applications of prior therapy, status changes, and interventions made in session) As of last visit, apprehensive about MRI, including compatibility of hypoglossal device, medication SE, and emerging risk of dementia.    Here with Berger of 45 yrs, who refers to [facility] as neuro spicy.  Main issues are apprehension with heart surgery frustrating his usual activity level.    Brain MRI normal.  DAT scan showing evidence of Parkinson's.  Formal neuropsych is being scheduled.  Doubtful of LBD.   Tremor and family h/o Parkinson's suggest PD, as do other items like swallowing issues.  Is in self-administered PT using PBS videos ([name]).  Working with frequent small meals, in view of swallowing issues and losing weight.  Uncertain if need to dice some of his food.     Enjoyed trip to [place]  with family.    Main stress is for TAVR surgery for severe aortic stenosis and restricted activity.  Consultation 9/30, but quandaries about what is OK to do.    Emotionally, the main stress is not being himself, [name].  Always been a bit melancholy, closer to depressed the last couple years.  Cognitive problems began with severe sleep apnea (193 AHI), that CPAP failed to treat, but Inspire and then side sleeping (enforced by placing a ball behind his back).  Starting to dream now, which may be encouraging  Encouraged, once transportation is restored (down one car) to sample men's group, maybe volunteer opportunities, small group through church.    [name] is active in [facility] [place] [facility] 5 mornings.   [name] has started picking up a few household chores -- vacuuming, apprenticing at dusting.  Voice and articulation, word-finding do so much to   Therapeutic modalities: {AM:23362::Cognitive Behavioral Therapy,Solution-Oriented/Positive Psychology}  Mental Status/Observations:  Appearance:   {PSY:22683}     Behavior:  {PSY:21022743}  Motor:  {PSY:22302}  Speech/Language:   {PSY:22685}  Affect:  {PSY:22687}  Mood:  {PSY:31886}  Thought process:  {PSY:31888}  Thought content:    {PSY:[phone]}  Sensory/Perceptual disturbances:    {PSY:815-110-2769}  Orientation:  {Psych Orientation:23301::Fully oriented}  Attention:  {Good-Fair-Poor ratings:23770::Good}    Concentration:  {Good-Fair-Poor ratings:23770::Good}  Memory:  {PSY:[id number]}  Insight:    {Good-Fair-Poor ratings:23770::Good}  Judgment:   {Good-Fair-Poor ratings:23770::Good}  Impulse Control:  {Good-Fair-Poor ratings:23770::Good}   Risk Assessment: Danger to Self: {Risk:22599::No} Self-injurious Behavior: {Risk:22599::No} Danger to Others: {Risk:22599::No} Physical Aggression / Violence: {Risk:22599::No} Duty to Warn: {AMYesNo:22526::No} Access to Firearms a concern: {AMYesNo:22526::No}  Assessment of progress:  {Progress:22147::progressing}  Diagnosis: No diagnosis found. Plan:  Mood -- Practice permission to be grieved by hardships, then pivot to available actions, and pivot from why to what/how questions in addressing them.  Maintain personal support and spiritual involvement as able.  Endorse opening up to adult children as well.  Available for Berger to join therapy if she sees fit, for further problem-solving and behavioral consultation adjusting to illness and treatment.   Lifestyle -- Maintain special and physical activity up to any medical limits Cognitive -- Defer assessment to neurology and potential neuropsychology if indicated.  Endorse continuing Namenda or  appropriate  alternative, and maintaining and/or strengthening supplementation with relevant B vitamins, D, and omega 3.  May look into ideas for cognitive stimulation and exercising executive function, word-finding, and any other affected faculties.  Option to ask about neuropsych testing and about neuro rehab or OT referral through neurology if interested in specialty care for cognitive issues. Other health concerns -- Ensure hypoglossal stimulator is in good adjustment to maintain airway.  Will discuss and prepare for procedures, forecast as needed what experiences to look for.   Other recommendations/advice -- As may be noted above.  Continue to utilize previously learned skills ad lib. Medication compliance -- Maintain medication as prescribed and work faithfully with relevant prescriber(s) if any changes are desired or seem indicated. Crisis service -- Aware of call list and work-in appts.  Call the clinic on-call service, 988/hotline, 911, or present to [facility] or ER if any life-threatening psychiatric crisis. Followup -- No follow-ups on file.  Next scheduled visit with me 12/10/2023.  Next scheduled in this office 12/10/2023.  David Kendall, PhD [name], PhD LP Clinical Psychologist, [facility] [address] [place], [place] [address] [phone]

## 2023-12-01 ENCOUNTER — Ambulatory Visit: Attending: Cardiology | Admitting: Cardiology

## 2023-12-01 ENCOUNTER — Encounter: Payer: Self-pay | Admitting: Cardiology

## 2023-12-01 VITALS — BP 106/56 | HR 86 | Ht 72.0 in | Wt 170.6 lb

## 2023-12-01 DIAGNOSIS — E7800 Pure hypercholesterolemia, unspecified: Secondary | ICD-10-CM | POA: Insufficient documentation

## 2023-12-01 DIAGNOSIS — I350 Nonrheumatic aortic (valve) stenosis: Secondary | ICD-10-CM | POA: Insufficient documentation

## 2023-12-01 LAB — CBC

## 2023-12-01 NOTE — Patient Instructions (Addendum)
 Lab Work: CBC BMP  If you have labs (blood work) drawn today and your tests are completely normal, you will receive your results only by: MyChart Message (if you have MyChart) OR A paper copy in the mail If you have any lab test that is abnormal or we need to change your treatment, we will call you to review the results.  Testing/Procedures: ECHOCARDIOGRAM (NEED BEFORE CATH)  Your physician has requested that you have an echocardiogram. Echocardiography is a painless test that uses sound waves to create images of your heart. It provides your doctor with information about the size and shape of your heart and how well your heart's chambers and valves are working. This procedure takes approximately one hour. There are no restrictions for this procedure. Please do NOT wear cologne, perfume, aftershave, or lotions (deodorant is allowed). Please arrive 15 minutes prior to your appointment time.  Please note: We ask at that you not bring children with you during ultrasound (echo/ vascular) testing. Due to room size and safety concerns, children are not allowed in the ultrasound rooms during exams. Our front office staff cannot provide observation of children in our lobby area while testing is being conducted. An adult accompanying a patient to their appointment will only be allowed in the ultrasound room at the discretion of the ultrasound technician under special circumstances. We apologize for any inconvenience.   LEFT AND RIGHT HEART CATH   Your physician has requested that you have a cardiac catheterization. Cardiac catheterization is used to diagnose and/or treat various heart conditions. Doctors may recommend this procedure for a number of different reasons. The most common reason is to evaluate chest pain. Chest pain can be a symptom of coronary artery disease (CAD), and cardiac catheterization can show whether plaque is narrowing or blocking your heart's arteries. This procedure is also used to  evaluate the valves, as well as measure the blood flow and oxygen levels in different parts of your heart. For further information please visit [email or web address] Please follow instruction sheet, as given.  Referral to Structural Clinic    Follow-Up: At [facility], you and your health needs are our priority.  As part of our continuing mission to provide you with exceptional heart care, our providers are all part of one team.  This team includes your primary Cardiologist (physician) and Advanced Practice Providers or APPs (Physician Assistants and Nurse Practitioners) who all work together to provide you with the care you need, when you need it.  Your next appointment:   2 week post cath   Provider:   One of our Advanced Practice Providers (APPs): [name], PA-C  [name], NP [name], NP  [name], PA-C [name], NP  [name], PA-C [name], PA-C  [place], [place] [name], NP  [name], NP [name], PA-C  [name], PA-C    [name], PA-C  [name], PA-C [name], NP [name], NP [name], PA-C  [name], NP [name], PA-C    [name], PA-C   Follow up with David Lawrence, MD IN 03/2024   Other Instructions   [place] [facility] A DEPT OF [facility] AT [facility] A DEPT OF THE [place]. [facility] [address] [place] [address] Dept: [phone] Loc: [phone]  [name]  12/01/2023  You are scheduled for a Cardiac Catheterization on Tuesday, October 14 with Dr. Newman Berger.  1. Please arrive  at the [facility] (Main Entrance A) at [facility]: [address] [place], [place] [address] at 5:30 AM (This time is 2 hour(s) before your procedure to ensure your preparation).   Free valet parking service is available. You will check in at ADMITTING. The support person will be asked to wait in the waiting room.  It is  OK to have someone drop you off and come back when you are ready to be discharged.    Special note: Every effort is made to have your procedure done on time. Please understand that emergencies sometimes delay scheduled procedures.  2. Diet: Nothing to eat after midnight.   3. Hydration: You need to be well hydrated before your procedure. On October 14, you may drink approved liquids (see below) until 2 hours before the procedure, with 16 oz of water as your last intake.   List of approved liquids water, clear juice, clear tea, black coffee, fruit juices, non-citric and without pulp, carbonated beverages, Gatorade, Kool -Aid, plain Jello-O and plain ice popsicles.  4. Labs: TODAY  5. Medication instructions in preparation for your procedure:   Contrast Allergy: No   On the morning of your procedure, take your Aspirin 81 mg and any morning medicines NOT listed above.  You may use sips of water.  6. Plan to go home the same day, you will only stay overnight if medically necessary. 7. Bring a current list of your medications and current insurance cards. 8. You MUST have a responsible person to drive you home. 9. Someone MUST be with you the first 24 hours after you arrive home or your discharge will be delayed. 10. Please wear clothes that are easy to get on and off and wear slip-on shoes.  Thank you for allowing us  to care for you!   -- [place] [facility]

## 2023-12-01 NOTE — Progress Notes (Signed)
 Cardiology Office Note:  .   Date:  12/01/2023  ID:  [name], DOB 05-11-1950, MRN [id number] PCP: Pcp, No  [facility] Providers Cardiologist:  [name], MD PCP: Pcp, No  Chief Complaint  Patient presents with   Moderate to severe aortic stenosis      [name] is a 73 y.o. 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  History of Present Illness  Patient is here today with his wife.  Recently, he has been seeing neurology for mild cognitive deficits.  Differential diagnosis has included early Alzheimer's versus parkinsonism.  He has further upcoming cognitive test scheduled.  He is able to perform his ADLs with little help.  He was also diagnosed with moderate to severe aortic stenosis based on echocardiogram performed at his PCP office at [facility] that showed EF of 60%, aortic valve mean gradient 40.7 mmHg, AVA 1.2 cm, AVAi 0.61 cm.  Images not available for my review.  Patient denies any exertional chest pain or shortness of breath, but has significantly cut down his activity since the above finding on echocardiogram.  Wife is concerned about reducing physical activity given that he is recommended to do certain exercises for suspected Parkinson's disease.  Patient has obstructive sleep apnea.  He did not tolerate CPAP.  OSA has improved after implantation of inspire device.    Vitals:   12/01/23 0829  BP: (!) 106/56  Pulse: 86  SpO2: 99%      Review of Systems  Cardiovascular:  Negative for chest pain, dyspnea on exertion, leg swelling, palpitations and syncope.        Studies Reviewed: SABRA        EKG 12/01/2023: Normal sinus rhythm Normal ECG When compared with ECG of 15-May-2005 21:16, No significant change was found     Labs 09/2023: HDL 51, LDL 112 Cr 0.85   Physical Exam Vitals and nursing note reviewed.  Constitutional:      General: He is not in acute distress. Neck:     Vascular: No JVD.  Cardiovascular:     Rate and Rhythm: Normal rate and regular rhythm.     Heart sounds: Normal heart sounds. No murmur heard. Pulmonary:     Effort: Pulmonary effort is normal.     Breath sounds: Normal breath sounds. No wheezing or rales.  Neurological:     Comments: Memory issues, mild      VISIT DIAGNOSES:   ICD-10-CM   1. Severe aortic stenosis  I35.0 EKG 12-Lead    CBC    Basic metabolic panel with GFR    ECHOCARDIOGRAM COMPLETE    Ambulatory referral to Structural Heart/Valve Clinic (only at [facility])    2. Elevated LDL cholesterol level  E78.00        [name] is a 73 y.o. 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 Mean gradient 40.7 mmHg, AVA 1.2 cm2, AVA index 0.6 cm2. By report, appears to be severe aortic stenosis. Patient does not have any obvious exertional chest pain, dyspnea, syncope symptoms.  However, he is cut down his physical activity significantly over the last 3 months since diagnosis of aortic stenosis. With his new diagnosis of possible  Parkinson's disease, wife is concerned about reducing his physical activity which can be detrimental to his Parkinson's disease. Given this background, it is reasonable to consider early TAVR. I will obtain updated echocardiogram, and proceed with coronary angiography and right heart catheterization for assessment of filling pressures. I will refer him to TAVR clinic.  Elevated LDL: Reasonable to wait till coronary angiogram before further recommendations regarding lipid-lowering therapy.   Informed Consent   Shared Decision Making/Informed Consent The risks  [stroke (1 in 1000), death (1 in 1000), kidney failure [usually temporary] (1 in 500), bleeding (1 in 200), allergic reaction [possibly serious] (1 in 200)], benefits (diagnostic support and management of coronary artery disease) and alternatives of a cardiac catheterization were discussed in detail with Mr. Seybold and he is willing to proceed.       No orders of the defined types were placed in this encounter.    F/u in 03/2024  Signed, [name], MD

## 2023-12-02 LAB — CBC
Hematocrit: 42 % (ref 37.5–51.0)
Hemoglobin: 14.3 g/dL (ref 13.0–17.7)
MCH: 33.2 pg — AB (ref 26.6–33.0)
MCHC: 34 g/dL (ref 31.5–35.7)
MCV: 97 fL (ref 79–97)
Platelets: 238 x10E3/uL (ref 150–450)
RBC: 4.31 x10E6/uL (ref 4.14–5.80)
RDW: 12.7 % (ref 11.6–15.4)
WBC: 6.3 x10E3/uL (ref 3.4–10.8)

## 2023-12-02 LAB — BASIC METABOLIC PANEL WITH GFR
BUN/Creatinine Ratio: 19 (ref 10–24)
BUN: 17 mg/dL (ref 8–27)
CO2: 24 mmol/L (ref 20–29)
Calcium: 9.3 mg/dL (ref 8.6–10.2)
Chloride: 101 mmol/L (ref 96–106)
Creatinine, Ser: 0.91 mg/dL (ref 0.76–1.27)
Glucose: 94 mg/dL (ref 70–99)
Potassium: 4.2 mmol/L (ref 3.5–5.2)
Sodium: 140 mmol/L (ref 134–144)
eGFR: 90 mL/min/1.73 (ref >59)

## 2023-12-08 ENCOUNTER — Ambulatory Visit (HOSPITAL_COMMUNITY)
Admission: RE | Admit: 2023-12-08 | Discharge: 2023-12-08 | Disposition: A | Discharge status: Home / Self Care | Source: Ambulatory Visit | Attending: Student in an Organized Health Care Education/Training Program | Admitting: Student in an Organized Health Care Education/Training Program

## 2023-12-08 ENCOUNTER — Ambulatory Visit: Payer: Self-pay | Admitting: Cardiology

## 2023-12-08 DIAGNOSIS — I350 Nonrheumatic aortic (valve) stenosis: Secondary | ICD-10-CM | POA: Insufficient documentation

## 2023-12-08 LAB — ECHOCARDIOGRAM COMPLETE
AR max vel: 1.1 cm2
AV Area VTI: 1.17 cm2
AV Area mean vel: 1.23 cm2
AV Mean grad: 40 mmHg
AV Peak grad: 79.6 mmHg
Ao pk vel: 4.46 m/s
Area-P 1/2: 3.12 cm2
MV M vel: 6.44 m/s
MV Peak grad: 165.9 mmHg
Radius: 0.43 cm
S' Lateral: 1.2 cm

## 2023-12-09 ENCOUNTER — Telehealth: Payer: Self-pay

## 2023-12-09 NOTE — Telephone Encounter (Signed)
 Called and s/w patient to reschedule appointment on 1/5 with Dr Ines. He would like to speak with his wife and then give us  a call back  If patient calls back, they can be rescheduled with Dr Margaret

## 2023-12-10 ENCOUNTER — Ambulatory Visit (INDEPENDENT_AMBULATORY_CARE_PROVIDER_SITE_OTHER): Admitting: Psychiatry

## 2023-12-10 DIAGNOSIS — R69 Illness, unspecified: Secondary | ICD-10-CM

## 2023-12-10 DIAGNOSIS — F4323 Adjustment disorder with mixed anxiety and depressed mood: Secondary | ICD-10-CM

## 2023-12-10 DIAGNOSIS — G3184 Mild cognitive impairment, so stated: Secondary | ICD-10-CM

## 2023-12-10 DIAGNOSIS — I350 Nonrheumatic aortic (valve) stenosis: Secondary | ICD-10-CM | POA: Diagnosis not present

## 2023-12-10 NOTE — Progress Notes (Signed)
 Psychotherapy Progress Note [facility] [name], PhD LP  Patient ID: [name] [facility])    MRN: [id number] Therapy format: Individual psychotherapy Date: 12/10/2023      Start: 3:15p     Stop: 4:05p     Time Spent: 50 min Location: In-person   Session narrative (presenting needs, interim history, self-report of stressors and symptoms, applications of prior therapy, status changes, and interventions made in session) Echocardiogram shows moderate severe aortic stenosis, will have catheterization next week.  Unclear if is strictly diagnostic or potentially when he could have TAVR, but he is a candidate.  Progress made reducing pantoprazole, with plan with GI to wean entirely.  Parkinson's getting clarified as a source for memory issues.  Neurologist leaving, transferring most likely to Dr. Margaret at [facility].  Affirmed and encouraged.  Has been able to get out walking with Deb a few times.  Slower pace, around [facility] and such.  Endorsed freedom to walk, daily if interested.    Inspire device continues to work well, far as he can tell.  In q 3 month service with pulmonology.  Discussed comprehension of services.  Emotionally, still wishes all the uncertainty could end and he could get back to more physical activity.  Support/validation provided.  Steady support of [name], his super chica.  Maintaining spirits.  Therapeutic modalities: Cognitive Behavioral Therapy, Solution-Oriented/Positive Psychology, Environmental manager, and Faith-sensitive  Mental Status/Observations:  Appearance:   Casual     Behavior:  Appropriate  Motor:  Minor tremor associated with Parkinson's  Speech/Language:   Clear and Coherent and some faltering  Affect:  Appropriate  Mood:  dysthymic  Thought process:  normal  Thought content:    WNL  Sensory/Perceptual disturbances:    WNL  Orientation:  Fully oriented  Attention:  Good    Concentration:  Good  Memory:  WNL   Insight:    Good  Judgment:   Good  Impulse Control:  Good   Risk Assessment: Danger to Self: No Self-injurious Behavior: No Danger to Others: No Physical Aggression / Violence: No Duty to Warn: No Access to Firearms a concern: No  Assessment of progress:  stabilized  Diagnosis:   ICD-10-CM   1. Adjustment disorder with mixed anxiety and depressed mood  F43.23     2. Mild cognitive impairment (unconfirmed Parkinson's)  G31.84     3. r/o dysthymia, early onset  R69     4. Severe aortic stenosis  I35.0      Plan:  Mood -- Practice permission to be grieved by hardships, then pivot to available actions, and pivot from why to what/how questions in addressing them.  Maintain personal support and spiritual involvement as able.  Endorse opening up to adult children as well.  Available for [name] to join therapy if she sees fit, for further problem-solving and behavioral consultation adjusting to illness and treatment.   Lifestyle -- Maintain special and physical activity up to any medical limits.  If unclear, let heaviness and fatigue be his guide and most likely stick to walking, no running or strong swimming.  Encourage enjoying changes of scenery where able. Cognitive -- Defer assessment to neurology and potential neuropsychology if indicated.  Endorse continuing Namenda or appropriate alternative, and maintaining and/or strengthening supplementation with relevant B vitamins, D, and omega 3.  May look into ideas for cognitive stimulation and exercising executive function, word-finding, and any other affected faculties.  Option to ask about neuropsych testing and about neuro rehab or OT  referral through neurology if interested in specialty care for cognitive issues. Other recommendations/advice -- As may be noted above.  Continue to utilize previously learned skills ad lib. Medication compliance -- Maintain medication as prescribed and work faithfully with relevant prescriber(s) if any  changes are desired or seem indicated. Crisis service -- Aware of call list and work-in appts.  Call the clinic on-call service, 988/hotline, 911, or present to [facility] or ER if any life-threatening psychiatric crisis. Followup -- Return for time as already scheduled.  Next scheduled visit with me 01/07/2024.  Next scheduled in this office 01/07/2024.  David Kendall, PhD [name], PhD LP Clinical Psychologist, [facility] [address] [place], [place] [address] [phone]

## 2023-12-14 ENCOUNTER — Telehealth: Payer: Self-pay | Admitting: *Deleted

## 2023-12-14 NOTE — Progress Notes (Signed)
 Patient ID: [name] MRN: [id number] DOB/AGE: September 07, 1950 73 y.o.  Primary Care Physician:Ramachandran, Lequita, MD Primary Cardiologist: Patwardhan  CC:  Aortic valvular disease management     FOCUSED PROBLEM LIST:   Aortic stenosis AVA 1.2, MG 40, V-max 4.4, EF 70 to 75% TTE October 2025 EKG sinus rhythm without bundle-branch blocks CAD Mild, coronary angiography October 2025 Hyperlipidemia Mild cognitive impairment Alzheimer's versus Parkinsonism Cognitive testing pending BMI 23/BSA 1.98  October 2025:  Patient consents to use of AI scribe. The patient is 73 year old male with above listed medical problems referred for recommendations regarding his aortic stenosis.  He has been experiencing increased shortness of breath, a new symptom for him. Activities such as walking, which he previously did without difficulty, now cause him to feel short of breath. He has a history of a benign heart murmur diagnosed several years ago, which was recently re-evaluated and found to be more concerning. An echocardiogram in June showed findings that were reported as severe aortic stenosis. He underwent a heart catheterization earlier this month, which showed no severe blockages in the arteries.  He has a history of forgetfulness and underwent testing for Alzheimer's disease, which returned normal results. However, a DAT scan indicated Parkinson's disease, although formal cognitive testing is still pending for a definitive diagnosis. His wife reports that he has shown signs of cognitive decline over the past few years.  He does not experience issues with breathing while lying flat and denies lightheadedness when standing quickly.  He has reduced his physical activity, limiting himself to walking and stretching. He feels more fatigued during activities such as vacuuming, which he could previously complete without issue.  He does not smoke.  He denies any dental issues.         Past  Medical History:  Diagnosis Date   Cancer (HCC)    larygneal 2003, ssq L eye used interferon gtts   Gout    OSA (obstructive sleep apnea)    has inspire 2023   SVT (supraventricular tachycardia)    benign heart murmur    Past Surgical History:  Procedure Laterality Date   NO PAST SURGERIES     RIGHT HEART CATH AND CORONARY ANGIOGRAPHY N/A 12/15/2023   Procedure: RIGHT HEART CATH AND CORONARY ANGIOGRAPHY;  Surgeon: Elmira Newman PARAS, MD;  Location: [facility] INVASIVE CV LAB;  Service: Cardiovascular;  Laterality: N/A;    Family History  Problem Relation Age of Onset   Parkinson's disease Paternal Uncle     Social History   Socioeconomic History   Marital status: Married    Spouse name: Not on file   Number of children: Not on file   Years of education: Not on file   Highest education level: Not on file  Occupational History   Not on file  Tobacco Use   Smoking status: Never   Smokeless tobacco: Not on file  Substance and Sexual Activity   Alcohol use: No   Drug use: No   Sexual activity: Not on file  Other Topics Concern   Not on file  Social History Narrative   Not on file   Social Drivers of Health   Financial Resource Strain: Not on file  Food Insecurity: Not on file  Transportation Needs: Not on file  Physical Activity: Not on file  Stress: Not on file  Social Connections: Not on file  Intimate Partner Violence: Not on file     Prior to Admission medications   Medication Sig Start Date End  Date Taking? Authorizing Provider  Cholecalciferol (VITAMIN D-3) 125 MCG (5000 UT) TABS Take 5,000 Units by mouth daily.    [provider]  Coenzyme Q10 200 MG capsule Take 200 mg by mouth daily.    [provider]  cyanocobalamin (VITAMIN B12) 1000 MCG tablet Take 1,000 mcg by mouth daily.    [provider]  memantine (NAMENDA) 10 MG tablet Take 10 mg by mouth 2 (two) times daily. 07/08/23   [provider]  Multiple Vitamins-Minerals  (MULTIVITAMIN WITH MINERALS) tablet Take 1 tablet by mouth daily.    [provider]  Omega-3 Fatty Acids (FISH OIL PO) Take 400 mg by mouth daily.    [provider]  pantoprazole (PROTONIX) 40 MG tablet Take 40 mg by mouth every other day. 09/02/23   [provider]    No Known Allergies  REVIEW OF SYSTEMS:  General: no fevers/chills/night sweats Eyes: no blurry vision, diplopia, or amaurosis ENT: no sore throat or hearing loss Resp: no cough, wheezing, or hemoptysis CV: no edema or palpitations GI: no abdominal pain, nausea, vomiting, diarrhea, or constipation GU: no dysuria, frequency, or hematuria Skin: no rash Neuro: no headache, numbness, tingling, or weakness of extremities Musculoskeletal: no joint pain or swelling Heme: no bleeding, DVT, or easy bruising Endo: no polydipsia or polyuria  BP 118/62   Pulse 83   Ht 6' (1.829 m)   Wt 168 lb 9.6 oz (76.5 kg)   SpO2 100%   BMI 22.87 kg/m   PHYSICAL EXAM: GEN:  AO x 3 in no acute distress HEENT: normal Dentition: Normal Neck: JVP normal. +2 carotid upstrokes without bruits. No thyromegaly. Lungs: equal expansion, clear bilaterally CV: Apex is discrete and nondisplaced, RRR with 3/6 SEM Abd: soft, non-tender, non-distended; no bruit; positive bowel sounds Ext: no edema, ecchymoses, or cyanosis Vascular: 2+ femoral pulses, 2+ radial pulses       Skin: warm and dry without rash Neuro: CN II-XII grossly intact; motor and sensory grossly intact    DATA AND STUDIES:  EKG: 2025 sinus rhythm without bundle-branch blocks  EKG Interpretation Date/Time:    Ventricular Rate:    PR Interval:    QRS Duration:    QT Interval:    QTC Calculation:   R Axis:      Text Interpretation:          CARDIAC STUDIES: Refer to CV Procedures and Imaging Tabs  09/23/2023: ALT 16 12/01/2023: BUN 17; Creatinine, Ser 0.91; Platelets 238 12/15/2023: Hemoglobin 12.2; Potassium 3.6; Sodium 139   STS RISK  CALCULATOR: Pending  NYHA CLASS: 2    ASSESSMENT AND PLAN:   1. Nonrheumatic aortic valve stenosis   2. Mild CAD   3. Hyperlipidemia LDL goal <70   4. Mild cognitive impairment   5. Pre-procedure lab exam     Aortic stenosis: The patient has developed NYHA II symptoms of shortness of breath and fatigue.  He has very much curtailed his activity in order not to develop symptoms.  Fortunately he has not had any higher symptoms such as presyncope or syncope.  He has had a coronary angiography study which was reassuring.  I will refer him for a CTA and cardiothoracic surgical appointment.  He has had no dental issues.  Further recommendations will be informed by the review of this CT scan and cardiothoracic surgical opinion.  Given his cognitive impairment he may not do well with the cardiopulmonary bypass run and therefore a minimally invasive TAVR procedure is  likely best for him. Mild coronary artery disease: Continue aspirin 81 mg, rosuvastatin 20 mg Hyperlipidemia: Continue rosuvastatin 20 mg Mild cognitive impairment: To undergo cognitive testing soon.   I have personally reviewed the patients imaging data as summarized above.  I have reviewed the natural history of aortic stenosis with the patient and family members who are present today. We have discussed the limitations of medical therapy and the poor prognosis associated with symptomatic aortic stenosis. We have also reviewed potential treatment options, including palliative medical therapy, conventional surgical aortic valve replacement, and transcatheter aortic valve replacement. We discussed treatment options in the context of this patient's specific comorbid medical conditions.   All of the patient's questions were answered today. Will make further recommendations based on the results of studies outlined above.   I spent 42 minutes reviewing all clinical data during and prior to this visit including all relevant imaging studies,  laboratories, clinical information from other health systems and prior notes from both Cardiology and other specialties, interviewing the patient, conducting a complete physical examination, and coordinating care in order to formulate a comprehensive and personalized evaluation and treatment plan.   Masaji Billups K Husein Guedes, MD  12/18/2023 4:40 PM    [facility] [address] [place], [place], [place]  [address] Phone: [phone]; Fax: [phone]

## 2023-12-14 NOTE — Telephone Encounter (Signed)
Reviewed procedure instructions with patient's wife (DPR).

## 2023-12-14 NOTE — Telephone Encounter (Signed)
 Cardiac Catheterization scheduled at [facility] for: Tuesday December 15, 2023 7:30 AM Arrival time [facility] Main Entrance A at: 5:30 AM  Diet: -Nothing to eat after midnight.  Hydration: -May drink clear liquids until 2 hours before the procedure.  Approved liquids: Water, clear tea, black coffee, fruit juices-non-citric and without pulp,Gatorade, plain Jello/popsicles.   -Please drink 16 oz of water 2 hours before procedure.  Medication instructions: -Usual morning medications can be taken including aspirin 81 mg.  Plan to go home the same day, you will only stay overnight if medically necessary.  You must have responsible adult to drive you home.  Someone must be with you the first 24 hours after you arrive home.  Reviewed procedure instructions with patient.  Pt requested I review instructions with his wife, [name]-I left message for her to call back.

## 2023-12-14 NOTE — Telephone Encounter (Signed)
 Pts wife turning call. Please advise.

## 2023-12-15 ENCOUNTER — Ambulatory Visit (HOSPITAL_COMMUNITY)
Admission: RE | Admit: 2023-12-15 | Discharge: 2023-12-15 | Disposition: A | Discharge status: Home / Self Care | Attending: Cardiology | Admitting: Cardiology

## 2023-12-15 ENCOUNTER — Other Ambulatory Visit: Payer: Self-pay

## 2023-12-15 ENCOUNTER — Encounter (HOSPITAL_COMMUNITY)
Admission: RE | Disposition: A | Discharge status: Home / Self Care | Payer: Self-pay | Source: Home / Self Care | Attending: Cardiology

## 2023-12-15 DIAGNOSIS — G4733 Obstructive sleep apnea (adult) (pediatric): Secondary | ICD-10-CM | POA: Insufficient documentation

## 2023-12-15 DIAGNOSIS — I350 Nonrheumatic aortic (valve) stenosis: Secondary | ICD-10-CM

## 2023-12-15 DIAGNOSIS — I2510 Atherosclerotic heart disease of native coronary artery without angina pectoris: Secondary | ICD-10-CM

## 2023-12-15 DIAGNOSIS — I2584 Coronary atherosclerosis due to calcified coronary lesion: Secondary | ICD-10-CM | POA: Diagnosis not present

## 2023-12-15 HISTORY — PX: RIGHT HEART CATH AND CORONARY ANGIOGRAPHY: CATH118264

## 2023-12-15 LAB — POCT I-STAT EG7
Acid-Base Excess: 2 mmol/L (ref 0.0–2.0)
Acid-Base Excess: 2 mmol/L (ref 0.0–2.0)
Acid-Base Excess: 2 mmol/L (ref 0.0–2.0)
Bicarbonate: 27.1 mmol/L (ref 20.0–28.0)
Bicarbonate: 27.4 mmol/L (ref 20.0–28.0)
Bicarbonate: 27.5 mmol/L (ref 20.0–28.0)
Calcium, Ion: 1.12 mmol/L — ABNORMAL LOW (ref 1.15–1.40)
Calcium, Ion: 1.21 mmol/L (ref 1.15–1.40)
Calcium, Ion: 1.21 mmol/L (ref 1.15–1.40)
HCT: 36 % — ABNORMAL LOW (ref 39.0–52.0)
HCT: 37 % — ABNORMAL LOW (ref 39.0–52.0)
HCT: 37 % — ABNORMAL LOW (ref 39.0–52.0)
Hemoglobin: 12.2 g/dL — ABNORMAL LOW (ref 13.0–17.0)
Hemoglobin: 12.6 g/dL — ABNORMAL LOW (ref 13.0–17.0)
Hemoglobin: 12.6 g/dL — ABNORMAL LOW (ref 13.0–17.0)
O2 Saturation: 76 %
O2 Saturation: 77 %
O2 Saturation: 79 %
Potassium: 3.5 mmol/L (ref 3.5–5.1)
Potassium: 3.6 mmol/L (ref 3.5–5.1)
Potassium: 3.6 mmol/L (ref 3.5–5.1)
Sodium: 139 mmol/L (ref 135–145)
Sodium: 139 mmol/L (ref 135–145)
Sodium: 141 mmol/L (ref 135–145)
TCO2: 28 mmol/L (ref 22–32)
TCO2: 29 mmol/L (ref 22–32)
TCO2: 29 mmol/L (ref 22–32)
pCO2, Ven: 43.9 mmHg — ABNORMAL LOW (ref 44–60)
pCO2, Ven: 46.6 mmHg (ref 44–60)
pCO2, Ven: 47 mmHg (ref 44–60)
pH, Ven: 7.375 (ref 7.25–7.43)
pH, Ven: 7.38 (ref 7.25–7.43)
pH, Ven: 7.398 (ref 7.25–7.43)
pO2, Ven: 43 mmHg (ref 32–45)
pO2, Ven: 43 mmHg (ref 32–45)
pO2, Ven: 43 mmHg (ref 32–45)

## 2023-12-15 LAB — POCT I-STAT 7, (LYTES, BLD GAS, ICA,H+H)
Acid-Base Excess: 2 mmol/L (ref 0.0–2.0)
Bicarbonate: 27.1 mmol/L (ref 20.0–28.0)
Calcium, Ion: 1.21 mmol/L (ref 1.15–1.40)
HCT: 38 % — ABNORMAL LOW (ref 39.0–52.0)
Hemoglobin: 12.9 g/dL — ABNORMAL LOW (ref 13.0–17.0)
O2 Saturation: 95 %
Potassium: 3.6 mmol/L (ref 3.5–5.1)
Sodium: 141 mmol/L (ref 135–145)
TCO2: 28 mmol/L (ref 22–32)
pCO2 arterial: 44.4 mmHg (ref 32–48)
pH, Arterial: 7.393 (ref 7.35–7.45)
pO2, Arterial: 76 mmHg — ABNORMAL LOW (ref 83–108)

## 2023-12-15 SURGERY — RIGHT HEART CATH AND CORONARY ANGIOGRAPHY
Anesthesia: LOCAL

## 2023-12-15 MED ORDER — ONDANSETRON HCL 4 MG/2ML IJ SOLN: 4.0000 mg | Freq: Four times a day (QID) | INTRAMUSCULAR | Status: DC | PRN

## 2023-12-15 MED ORDER — VERAPAMIL HCL 2.5 MG/ML IV SOLN
INTRAVENOUS | Status: DC | PRN
  Administered 2023-12-15: 10 mL via INTRA_ARTERIAL

## 2023-12-15 MED ORDER — ASPIRIN 81 MG PO TBEC: 81.0000 mg | DELAYED_RELEASE_TABLET | Freq: Every day | ORAL | 3 refills | Status: AC

## 2023-12-15 MED ORDER — MIDAZOLAM HCL 2 MG/2ML IJ SOLN
INTRAMUSCULAR | Status: DC | PRN
  Administered 2023-12-15: 1 mg via INTRAVENOUS

## 2023-12-15 MED ORDER — SODIUM CHLORIDE 0.9 % IV SOLN: 250.0000 mL | INTRAVENOUS | Status: DC | PRN

## 2023-12-15 MED ORDER — FENTANYL CITRATE (PF) 100 MCG/2ML IJ SOLN
INTRAMUSCULAR | Status: DC | PRN
  Administered 2023-12-15: 25 ug via INTRAVENOUS

## 2023-12-15 MED ORDER — SODIUM CHLORIDE 0.9% FLUSH: 3.0000 mL | INTRAVENOUS | Status: DC | PRN

## 2023-12-15 MED ORDER — FENTANYL CITRATE (PF) 100 MCG/2ML IJ SOLN
INTRAMUSCULAR | Status: AC
  Filled 2023-12-15: qty 2

## 2023-12-15 MED ORDER — ACETAMINOPHEN 325 MG PO TABS: 650.0000 mg | ORAL_TABLET | ORAL | Status: DC | PRN

## 2023-12-15 MED ORDER — VERAPAMIL HCL 2.5 MG/ML IV SOLN
INTRAVENOUS | Status: AC
  Filled 2023-12-15: qty 2

## 2023-12-15 MED ORDER — LIDOCAINE HCL (PF) 1 % IJ SOLN
INTRAMUSCULAR | Status: AC
  Filled 2023-12-15: qty 30

## 2023-12-15 MED ORDER — HEPARIN (PORCINE) IN NACL 1000-0.9 UT/500ML-% IV SOLN
INTRAVENOUS | Status: DC | PRN
  Administered 2023-12-15: 1000 mL

## 2023-12-15 MED ORDER — LIDOCAINE HCL (PF) 1 % IJ SOLN
INTRAMUSCULAR | Status: DC | PRN
  Administered 2023-12-15: 10 mL via INTRADERMAL

## 2023-12-15 MED ORDER — FREE WATER: 500.0000 mL | Freq: Once | Status: DC

## 2023-12-15 MED ORDER — ROSUVASTATIN CALCIUM 20 MG PO TABS: 20.0000 mg | ORAL_TABLET | Freq: Every day | ORAL | 3 refills | Status: AC

## 2023-12-15 MED ORDER — SODIUM CHLORIDE 0.9% FLUSH: 3.0000 mL | Freq: Two times a day (BID) | INTRAVENOUS | Status: DC

## 2023-12-15 MED ORDER — ASPIRIN 81 MG PO CHEW: 81.0000 mg | CHEWABLE_TABLET | ORAL | Status: DC

## 2023-12-15 MED ORDER — HEPARIN SODIUM (PORCINE) 1000 UNIT/ML IJ SOLN
INTRAMUSCULAR | Status: AC
  Filled 2023-12-15: qty 10

## 2023-12-15 MED ORDER — MIDAZOLAM HCL 2 MG/2ML IJ SOLN
INTRAMUSCULAR | Status: AC
  Filled 2023-12-15: qty 2

## 2023-12-15 MED ORDER — LABETALOL HCL 5 MG/ML IV SOLN: 10.0000 mg | INTRAVENOUS | Status: DC | PRN

## 2023-12-15 MED ORDER — HEPARIN SODIUM (PORCINE) 1000 UNIT/ML IJ SOLN
INTRAMUSCULAR | Status: DC | PRN
  Administered 2023-12-15: 4000 [IU] via INTRAVENOUS

## 2023-12-15 MED ORDER — HYDRALAZINE HCL 20 MG/ML IJ SOLN
10.0000 mg | INTRAMUSCULAR | Status: DC | PRN
Start: 2023-12-15 — End: 2023-12-15

## 2023-12-15 SURGICAL SUPPLY — 10 items
CATH BALLN WEDGE 5F 110CM (CATHETERS) IMPLANT
CATH INFINITI 5 FR AR1 MOD (CATHETERS) IMPLANT
CATH INFINITI AMBI 5FR TG (CATHETERS) IMPLANT
CATH INFINITI JR4 5F (CATHETERS) IMPLANT
GLIDESHEATH SLEND A-KIT 6F 22G (SHEATH) IMPLANT
GUIDEWIRE INQWIRE 1.5J.035X260 (WIRE) IMPLANT
KIT SYRINGE INJ CVI SPIKEX1 (MISCELLANEOUS) IMPLANT
PACK CARDIAC CATHETERIZATION (CUSTOM PROCEDURE TRAY) ×1 IMPLANT
SET ATX-X65L (MISCELLANEOUS) IMPLANT
SHEATH GLIDE SLENDER 4/5FR (SHEATH) IMPLANT

## 2023-12-15 NOTE — H&P (Signed)
 OV 12/01/2023 copied for documentation   Cardiology Office Note:  .   Date:  12/15/2023  ID:  [name], DOB 12-25-1950, MRN [id number] PCP: Verdia Lombard, MD  [facility] Providers Cardiologist:  [name], MD PCP: Verdia Lombard, MD  C/C: Severe aortic stenosis    IZEL EISENHARDT is a 73 y.o. 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  History of Present Illness  Patient is here today with his wife.  Recently, he has been seeing neurology for mild cognitive deficits.  Differential diagnosis has included early Alzheimer's versus parkinsonism.  He has further upcoming cognitive test scheduled.  He is able to perform his ADLs with little help.  He was also diagnosed with moderate to severe aortic stenosis based on echocardiogram performed at his PCP office at [facility] that showed EF of 60%, aortic valve mean gradient 40.7 mmHg, AVA 1.2 cm, AVAi 0.61 cm.  Images not available for my review.  Patient denies any exertional chest pain or shortness of breath, but has significantly cut down his activity since the above finding on echocardiogram.  Wife is concerned about reducing physical activity given that he is recommended to do certain exercises for suspected Parkinson's disease.  Patient has obstructive sleep apnea.  He did not tolerate CPAP.  OSA has improved after implantation of inspire device.    Vitals:   12/15/23 0558 12/15/23 0732  BP: (!) 141/76   Pulse: 74   Resp: 20   Temp: 99 F (37.2 C)   SpO2: 99% 100%      Review of Systems  Cardiovascular:  Negative for chest pain, dyspnea on exertion, leg swelling, palpitations and syncope.        Studies Reviewed: SABRA        EKG 12/01/2023: Normal sinus rhythm Normal ECG When compared  with ECG of 15-May-2005 21:16, No significant change was found    Labs 09/2023: HDL 51, LDL 112 Cr 0.85   Physical Exam Vitals and nursing note reviewed.  Constitutional:      General: He is not in acute distress. Neck:     Vascular: No JVD.  Cardiovascular:     Rate and Rhythm: Normal rate and regular rhythm.     Heart sounds: Normal heart sounds. No murmur heard. Pulmonary:     Effort: Pulmonary effort is normal.     Breath sounds: Normal breath sounds. No wheezing or rales.  Neurological:     Comments: Memory issues, mild      VISIT DIAGNOSES: No diagnosis found.    GOLDMAN BIRCHALL is a 73 y.o. 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 Mean gradient 40.7 mmHg, AVA 1.2 cm2, AVA index 0.6 cm2. By report, appears to be severe aortic stenosis. Patient does not have any obvious exertional chest pain, dyspnea, syncope symptoms.  However, he is cut down his physical activity significantly over the last 3 months since diagnosis of aortic stenosis. With his new diagnosis of possible Parkinson's disease, wife is concerned about reducing his physical activity which can be detrimental to his Parkinson's disease. Given this background, it is reasonable to consider early TAVR. I will obtain updated echocardiogram, and proceed with coronary angiography and right  heart catheterization for assessment of filling pressures. I will refer him to TAVR clinic.  Elevated LDL: Reasonable to wait till coronary angiogram before further recommendations regarding lipid-lowering therapy.   Informed Consent   Shared Decision Making/Informed Consent The risks [stroke (1 in 1000), death (1 in 1000), kidney failure [usually temporary] (1 in 500), bleeding (1 in 200), allergic reaction [possibly serious] (1 in 200)], benefits  (diagnostic support and management of coronary artery disease) and alternatives of a cardiac catheterization were discussed in detail with Mr. Lacina and he is willing to proceed.       Meds ordered this encounter  Medications   aspirin chewable tablet 81 mg   free water 500 mL   sodium chloride flush (NS) 0.9 % injection 3 mL   sodium chloride flush (NS) 0.9 % injection 3 mL   0.9 %  sodium chloride infusion   Heparin (Porcine) in NaCl 1000-0.9 UT/500ML-% SOLN     F/u in 03/2024  Signed, [name], MD

## 2023-12-15 NOTE — Discharge Instructions (Signed)

## 2023-12-15 NOTE — Interval H&P Note (Signed)
 History and Physical Interval Note:  12/15/2023 7:46 AM  [name]  has presented today for surgery, with the diagnosis of AS.  The various methods of treatment have been discussed with the patient and family. After consideration of risks, benefits and other options for treatment, the patient has consented to  Procedure(s): RIGHT/LEFT HEART CATH AND CORONARY ANGIOGRAPHY (N/A) as a surgical intervention.  The patient's history has been reviewed, patient examined, no change in status, stable for surgery.  I have reviewed the patient's chart and labs.  Questions were answered to the patient's satisfaction.     Krystalyn Kubota J Erionna Strum

## 2023-12-16 ENCOUNTER — Encounter (HOSPITAL_COMMUNITY): Payer: Self-pay | Admitting: Cardiology

## 2023-12-18 ENCOUNTER — Ambulatory Visit: Attending: Internal Medicine | Admitting: Internal Medicine

## 2023-12-18 VITALS — BP 118/62 | HR 83 | Ht 72.0 in | Wt 168.6 lb

## 2023-12-18 DIAGNOSIS — G3184 Mild cognitive impairment, so stated: Secondary | ICD-10-CM

## 2023-12-18 DIAGNOSIS — Z01812 Encounter for preprocedural laboratory examination: Secondary | ICD-10-CM

## 2023-12-18 DIAGNOSIS — I350 Nonrheumatic aortic (valve) stenosis: Secondary | ICD-10-CM

## 2023-12-18 DIAGNOSIS — E785 Hyperlipidemia, unspecified: Secondary | ICD-10-CM

## 2023-12-18 DIAGNOSIS — I2510 Atherosclerotic heart disease of native coronary artery without angina pectoris: Secondary | ICD-10-CM | POA: Diagnosis not present

## 2023-12-18 MED ORDER — METOPROLOL TARTRATE 100 MG PO TABS: 100.0000 mg | ORAL_TABLET | Freq: Once | ORAL | 0 refills | Status: DC

## 2023-12-18 NOTE — Addendum Note (Signed)
 Addended by: JANIT GENI CROME on: 12/18/2023 05:43 PM   Modules accepted: Orders

## 2023-12-18 NOTE — Progress Notes (Signed)
 Pre Surgical Assessment: 5 M Walk Test  95M=16.52ft  5 Meter Walk Test- trial 1: 5.77 seconds 5 Meter Walk Test- trial 2: 5.92 seconds 5 Meter Walk Test- trial 3: 5.69 seconds 5 Meter Walk Test Average: 5.80 seconds

## 2023-12-18 NOTE — Patient Instructions (Signed)
 Medication Instructions:  Your physician recommends that you continue on your current medications as directed. Please refer to the Current Medication list given to you today.  *If you need a refill on your cardiac medications before your next appointment, please call your pharmacy*  Lab Work: Please complete a BMET in our first floor lab before you leave today.  If you have labs (blood work) drawn today and your tests are completely normal, you will receive your results only by: MyChart Message (if you have MyChart) OR A paper copy in the mail If you have any lab test that is abnormal or we need to change your treatment, we will call you to review the results.  Testing/Procedures:   Your cardiac CT will be scheduled at one of the below locations:    [facility] [address]  [place], [place] [address]  If scheduled at the Heart and Vascular Tower at [address], please enter the parking lot using the [address] entrance and use the FREE valet service at the patient drop-off area. Enter the building and check-in with registration on the main floor.   Please follow these instructions carefully (unless otherwise directed):  An IV will be required for this test and Nitroglycerin will be given.  Hold all erectile dysfunction medications at least 3 days (72 hrs) prior to test. (Ie viagra, cialis, sildenafil, tadalafil, etc)   On the Night Before the Test: Be sure to Drink plenty of water. Do not consume any caffeinated/decaffeinated beverages or chocolate 12 hours prior to your test. Do not take any antihistamines 12 hours prior to your test.   On the Day of the Test: Drink plenty of water until 1 hour prior to the test. Do not eat any food 1 hour prior to test. You may take your regular medications prior to the test.  Take metoprolol (Lopressor) two hours prior to test. If you take  Furosemide/Hydrochlorothiazide/Spironolactone/Chlorthalidone, please HOLD on the morning of the test. Patients who wear a continuous glucose monitor MUST remove the device prior to scanning.   After the Test: Drink plenty of water. After receiving IV contrast, you may experience a mild flushed feeling. This is normal. On occasion, you may experience a mild rash up to 24 hours after the test. This is not dangerous. If this occurs, you can take Benadryl 25 mg, Zyrtec, Claritin, or Allegra and increase your fluid intake. (Patients taking Tikosyn should avoid Benadryl, and may take Zyrtec, Claritin, or Allegra) If you experience trouble breathing, this can be serious. If it is severe call 911 IMMEDIATELY. If it is mild, please call our office.  We will call to schedule your test 2-4 weeks out understanding that some insurance companies will need an authorization prior to the service being performed.   For more information and frequently asked questions, please visit our website : [email or web address]  For non-scheduling related questions, please contact the cardiac imaging nurse navigator should you have any questions/concerns: Cardiac Imaging Nurse Navigators Direct Office Dial: [phone]   For scheduling needs, including cancellations and rescheduling, please call [place], [phone].   Follow-Up: At [facility], you and your health needs are our priority.  As part of our continuing mission to provide you with exceptional heart care, our providers are all part of one team.  This team includes your primary Cardiologist (physician) and Advanced Practice Providers or APPs (Physician Assistants and Nurse Practitioners) who all work together to provide you with the care you need,  when you need it.  Your next appointment will be with Cardiothoracic Surgery. Someone from their office will call you to schedule and appointment.    We recommend signing up for the patient  portal called MyChart.  Sign up information is provided on this After Visit Summary.  MyChart is used to connect with patients for Virtual Visits (Telemedicine).  Patients are able to view lab/test results, encounter notes, upcoming appointments, etc.  Non-urgent messages can be sent to your provider as well.   To learn more about what you can do with MyChart, go to [email or web address].

## 2023-12-19 ENCOUNTER — Ambulatory Visit: Payer: Self-pay | Admitting: Internal Medicine

## 2023-12-19 LAB — BASIC METABOLIC PANEL WITH GFR
BUN/Creatinine Ratio: 21 (ref 10–24)
BUN: 16 mg/dL (ref 8–27)
CO2: 27 mmol/L (ref 20–29)
Calcium: 9.1 mg/dL (ref 8.6–10.2)
Chloride: 101 mmol/L (ref 96–106)
Creatinine, Ser: 0.78 mg/dL (ref 0.76–1.27)
Glucose: 79 mg/dL (ref 70–99)
Potassium: 4.4 mmol/L (ref 3.5–5.2)
Sodium: 140 mmol/L (ref 134–144)
eGFR: 95 mL/min/1.73 (ref >59)

## 2023-12-21 ENCOUNTER — Other Ambulatory Visit: Payer: Self-pay

## 2023-12-21 DIAGNOSIS — I350 Nonrheumatic aortic (valve) stenosis: Secondary | ICD-10-CM

## 2023-12-30 ENCOUNTER — Encounter: Attending: Psychology | Admitting: Psychology

## 2023-12-30 ENCOUNTER — Encounter: Payer: Self-pay | Admitting: Psychology

## 2023-12-30 DIAGNOSIS — R4189 Other symptoms and signs involving cognitive functions and awareness: Secondary | ICD-10-CM | POA: Insufficient documentation

## 2023-12-30 NOTE — Progress Notes (Signed)
 NEUROPSYCHOLOGICAL EVALUATION [place]  Physical Medicine and Rehabilitation     Patient: [name]  MRN: [id number] DOB: May 14, 1950  Age: 73 y.o. Sex: male  Race/Ethnicity: White or Caucasian  Years of Education: 17 Handedness: Right  Collateral Information Source: [name] Furnish (Spouse)  Referring Provider: Ines Onetha NOVAK, MD  Provider/Clinical Neuropsychologist: Evalene DOROTHA Riff, PsyD  Date of Service: 12/30/2023 Start Time: 3 PM End Time: 5:40 PM  Location of Service:  [facility] Physical Medicine & Rehabilitation Department [place]. [facility] [address] [place] [place], [place] [address] Phone: [phone]  Billing Code/Service:            [phone]  Individuals Present: Patient was seen, accompanied by his spouse with his permission, in-person, by the provider. 1 hour and 50 minutes spent in face-to-face clinical interview and remaining 50 minutes was spent in record review, documentation, and testing protocol construction.    PATIENT CONSENT AND CONFIDENTIALITY The patient's understanding of the reason for referral was intact. Discussed limits of confidentiality including, but not limited to, posting of final evaluation report in the patient's electronic medical record for both the patient and for the referring provider and appropriate medical professionals. Patient was given the opportunity to have their questions answered. The neuropsychological evaluation process was discussed with the patient and they consented to proceed with the evaluation.  Consent for Evaluation and Treatment: Signed: Yes Explanation of Privacy Policies: Signed: Yes Discussion of Confidentiality Limits: Yes  REASON FOR REFERRAL:The patient was referred for neuropsychological evaluation by his neurologist, Dr. Ines, for concern for cognitive decline. The patient was initially seen by Dr. Ines on 09/23/2023, referred by Dr. Verdia  due to concerns for cognitive decline. Patient history per referring provider notes indicated he was started on memantine in March, he was diagnosed with OSA 15 years prior but unable to tolerate CPAP (AHI was reportedly 100 per spouse), and he has inspire implant device for treatment. Per visit notes, memory difficulties have been worsening over the last 1-2 years, manifesting in word-finding. Course was felt to be progressive. Mood has declined and some difficulty with adjustment to retirement. He is in therapy. No significant hallucination, delusion, or behavior changes. Difficulties impact predominantly expressive speech descriptions indicate some possible problems with receptive speech. Difficulties with complex instructions were also endorsed. He has a family history of parkinson's disease (Uncle). No problems with attention/concentration. Tremor is present in his right hand and there is family history of this. No RBDs or significant problems with balance. MMSE score was 25/30 on 09/23/23. Subsequent work up showed ATN profile was positive for AD bio-markers (A+ T+ N-). APOE genotype was E3/E4, showing some increased risk for late life AD. MRI of the brain was unremarkable. The patient was diagnosed with Mild cognitive impairment, cognitive decline, and parkinsonian features. Neuropsychological evaluation was ordered as part of the work-up for facilitating differential diagnosis.   The reason for the referral and purpose of the evaluation, and evaluation process, was discussed and the patient and his wife. The patient expressed his understanding and agreed to proceed with the evaluation process.   HISTORY OF PRESENTING CONCERNS:  The patient was accompanied by his spouse to the clinical interview for the current evaluation, with his permission. In addition to information provided by the patient, his wife provided details based on her observations.  Symptom Onset & Course: Motor symptoms (e.g., changes in  handwriting) were suspected to have begun around 2021-2022 and were later followed  by cognitive changes (e.g., word-finding) within the last two years. There have been significant life transitions occurring within this time frame as well (e.g., retirement), as well as health issues (cardiovascular, OSA). The patient indicated he feels that his symptoms have stabilized somewhat over the last several months since starting on memantadine.    Current Cognitive Complaints:  Memory: The patient denies noticing any significant changes or problems in short term memory. His wife similarly has not noticed memory difficulties in the patient. The patient denied any problems with remembering conversation, recent events, medications, appointments/obligations, and has no difficulties involving getting lost in familiar places. His wife denied observing the patient repeat himself unintentionally.   Processing Speed: The patient and his wife indicated the patient has historically been someone to take his time with tasks. This has reportedly remained unchanged from baseline.   Attention & Concentration: The patient reported experiencing some possible declines in attention/concentration, and being more easily distracted. The patient's wife described premorbid tendencies suggesting difficulties with divided attention/working memory. The patient's wife denied noticing any indications of fluctuations in alertness, although notes the patient's tendency to fall asleep rapidly when sleep difficulties are more prominent.   Language: The patient endorsed significant difficulties with word-finding. His wife denied observing paraphasic errors. No significant difficulties with receptive language were endorsed, although the patient's responses to questions during the interview were sometimes suggestive of possible receptive language struggles. The patient's handwriting has reportedly declined due to tremor. The patient's handwriting has  always been particularly small in size, potentially limiting detection of micrographia. Difficulties with mental arithmetic are long-running. The patient reads regularly, and has difficulties suggestive more so of variable attention.   Visual-Spatial: No clear indications of changes in visual-spatial abilities.   Executive Functioning: No significant changes in personality or changes in behavior. No changes in social interactions/comportment. No indications of declines in planning/organization or basic problem solving or judgment.   Motor/Sensory Complaints:  Sensory changes: Historically has had somewhat reduced sense of smell, although his wife feels this has declined further in recent years. The patient denied noticing changes in sense of taste. No significant problems or changes in hearing or vision.  Balance/coordination difficulties: Intact.  Frequent instances of dizziness/vertigo: Denied Other motor difficulties: Tremor, right hand.   Emotional and Behavioral Functioning:  The patient experienced multiple traumatic events. He does not endorse any indications of active PTSD symptoms. He reported some difficulties with symptoms related to depression at points in his life, but these were linked to external factors/circumstances. The patient denied significant difficulties with anxiety prior to recently. The patient endorsed some indications of depression related symptoms involving slightly reduced mood, reduced motivation, less enjoyment out of activities. He also described indications of more generalized-like worry that has been present over the last year. 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 He engaged in individual therapy around that time as well. He was quite active until recently, when cardiovascular concerns have resulted in needing to remain sedentary until receiving treatment. The patient described  struggling with hypophonia and ability to engage with others in verbally, noting that this impacted core aspects of his identity in major ways and in ways connected to his life experiences. The patient has connected with an individual therapist and met with them three times so far.    The patient described experiencing auditory hallucinations in which he hears the voice of someone he knows behind him while at  home alone. He turns around and the person is not there. He indicated that his happens around once per week and he feels this reflects a desire to be around people. He has historically been introverted in some respects, but social connections have similarly been a core part of his life.   No history of panic attack. No paranoia, mania, suicidal ideation, homicidal ideation, prior psychiatric treatment outside of that mentioned above, no history of inpatient psychiatric admission.   Sleep: Sleeps 6-8 hours per night. There are some indications, based on his reports during conversation, that he may benefit from slightly more sleep. His wife reported acting out of dreams a few years ago that resolved entirely once OSA was managed.  Appetite: Good Caffeine: None currently.  Alcohol Use: None Tobacco Use: None Recreational Substance Use: None   Level of Functional Independence: The patient is intact with basic and instrumental activities of daily living from a cognitive perspective.   Medical History/Record Review: Per records and patient report, History of traumatic brain injury/concussion: None   History of stroke: None   History of heart attack: None   History of cancer/chemotherapy:larygneal cancer, 2003, treated via radiation per patient report.    History of seizure activity: None   Symptoms of chronic pain: None   Experience of frequent headaches/migraines: None    Imaging/Lab Results:  Per referring provider notes; Labs included on April 29, 2023: RPR nonreactive, CBC normal,  B12 673 normal, folate normal, CMP with AST of 90 which is elevated otherwise normal with BUN 13 and creatinine 0.81, TSH normal 1.92.  10/20/23 EXAM: NUCLEAR MEDICINE BRAIN IMAGING WITH SPECT  (DaTscan  ) COMPARISON:  Brain MRI [id number] FINDINGS: Marked decreased radiotracer activity within the LEFT striatum. Absent radiotracer activity in the LEFT putamen. Decreased radiotracer activity in the LEFT head of the caudate nucleus. Overall reduced radiotracer activity within the RIGHT striatum however maintained normal shape and mild activity in the head of caudate nucleus and putamen. IMPRESSION: Marked decreased activity in the LEFT striatum as described above. Additional decreased activity in the RIGHT stratum to lesser degree. Findings suggestive of Parkinson's syndrome pathology. Of note, DaTSCAN  is not diagnostic of Parkinsonian syndromes, which remains a clinical diagnosis. DaTscan  is an adjuvant test to aid in the clinical diagnosis of Parkinsonian syndrome  Past Medical History:  Diagnosis Date   Cancer (HCC)    larygneal 2003, ssq L eye used interferon gtts   Gout    OSA (obstructive sleep apnea)    has inspire 2023   SVT (supraventricular tachycardia)    benign heart murmur   Patient Active Problem List   Diagnosis Date Noted   Severe aortic stenosis 12/01/2023   Elevated LDL cholesterol level 12/01/2023   Mild cognitive impairment 09/27/2023   Tremor 09/27/2023   Onychomycosis 09/12/2021   Family Neurologic/Medical Hx: Tremor in dominant hand in all siblings.  Family History  Problem Relation Age of Onset   Parkinson's disease Paternal Uncle    Medications:  aspirin EC 81 MG tablet Cholecalciferol (VITAMIN D-3) 125 MCG (5000 UT) TABS Coenzyme Q10 200 MG capsule cyanocobalamin (VITAMIN B12) 1000 MCG tablet memantine (NAMENDA) 10 MG tablet metoprolol tartrate (LOPRESSOR) 100 MG tablet (Expired) Multiple Vitamins-Minerals (MULTIVITAMIN WITH MINERALS)  tablet Omega-3 Fatty Acids (FISH OIL PO) rosuvastatin (CRESTOR) 20 MG tablet   Academic/Vocational History: The patient immigrated from [place] at the age of 89 and English is his second language. He performed very well in school, graduated cum laude from college and made progress  on a masters degree when deciding to go into the seminary. The patient's wife described indications of some ASD-like traits in the patient involving subtle difficulties in judging nonverbal communication and tendencies towards slight ridged focus on routine. Difficulties could be neurodevelopmental or environmental, but appear sub-clinical from a diagnostic perspective and not clearly causing any functional difficulties in any case. The patient denied any clear indications of ADHD symptomology.     Psychosocial: Marital Status: 30 Children/Grandchildren: 3 adult children and six grandchildren.  Living Situation: Lives with wife.  Daily Activities/Hobbies: Primarily reading. Was previously very active physically.   Mental Status/Behavioral Observations: The patient was seen on an outpatient basis in the [facility] PM&R office for the clinical interview accompanied by his wife, who provided collateral, with his permission.  Sensorium/Arousal: Alert. Hearing and vision were adequate for the purpose of the evaluation.  Orientation: Intact.  Appearance: Appropriate dress and hygiene. Behavior: Friendly, attentive, cooperative.  Speech/Language: Hypophonia present. Some word-finding difficulties. Otherwise patient speech was well-articulated, prosodic, and fluent outside of word finding difficulties. Some subtle, possible, indications of difficulties with receptive speech but this could be secondary to other factors.  Motor: Tremor in right hand. Ambulated independently and without issue.  Social Comportment: WNL Mood: Euthymic Affect: Congruent Thought Process/Content: Coherent, linear, goal directed overall. No indications  of thought disorder.  Ability to Participate in Interview: Readily answered all questions posed during the clinical interview with adequate detail regarding personal history.  Insight: WNL  SUMMARY / CLINICAL IMPRESSIONS The patient was referred for neuropsychological evaluation by his neurologist, Dr. Ines, for concern for cognitive decline. The patient was initially seen by Dr. Ines on 09/23/2023, referred by Dr. Verdia due to concerns for cognitive decline. Patient history per referring provider notes indicated he was started on memantine in March, he was diagnosed with OSA 15 years prior but unable to tolerate CPAP (AHI was reportedly 100 per spouse), and he has inspire implant device for treatment. Per visit notes, memory difficulties have been worsening over the last 1-2 years, manifesting in word-finding. Course was felt to be progressive. Mood has declined and some difficulty with adjustment to retirement. He is in therapy. No significant hallucination, delusion, or behavior changes. Difficulties impact predominantly expressive speech descriptions indicate some possible problems with receptive speech. Difficulties with complex instructions were also endorsed. He has a family history of parkinson's disease (Uncle). No problems with attention/concentration. Tremor is present in his right hand and there is family history of this. No RBDs or significant problems with balance. MMSE score was 25/30 on 09/23/23. Subsequent work up showed ATN profile was positive for AD bio-markers (A+ T+ N-). APOE genotype was E3/E4, showing some increased risk for late life AD. MRI of the brain was unremarkable. The patient was diagnosed with Mild cognitive impairment, cognitive decline, and parkinsonian features. Neuropsychological evaluation was ordered as part of the work-up for facilitating differential diagnosis.   The reason for the referral and purpose of the evaluation, and evaluation process, was discussed  and the patient and his wife. The patient expressed his understanding and agreed to proceed with the evaluation process. The patient's motor related symptoms began around 2021-2022 and were followed by cognitive changes occurring around three years later. Cognitive changes involve word-finding difficulties. The patient may have had some possible premorbid weaknesses in processing speed and aspects of attention, specifically working memory, based on his wife's descriptions. The patient has some possible subtle indications of ASD related characteristics, but nothing warranting diagnosis. The patient  describes some possible declines in attention/concentration. No significant changes or problems were endorsed within short-term memory, visual-spatial abilities, and executive functioning. The patient is functionally intact from a cognitive perspective. He has had some struggles with depression-like symptoms in his life, but has a significant history of trauma and experienced many losses, starting at an early age. He described onset of attention difficulties more recently. The patient has significant OSA, which has improved considerably with inspire device implant. He is currently engaged with individual therapy for mental health treatment. The patient does endorse some possible auditory hallucination with is recurring and has recent onset. Situational factors are significant as they have further disrupted lifestyle and challenged identity beyond the already considerable challenges associated with transitioning into retirement. Formal cognitive evaluation is indicated for facilitating differential diagnosis and treatment planning.   DISPOSITION / PLAN The patient will be set up for a formal neuropsychological assessment to objectively assess her cognitive functioning across domains to establish the patient's cognitive profile. The patient is expected to undergo a cardiovascular procedure sometime in November, the date  of which will be known after an upcoming medical appointment. As discussed with the patient and his spouse during the interview, the patient and his spouse will reach out to schedule the date for cognitive testing once procedure date is known. Cognitive data from the evaluation, in conjunction with information obtained via clinical interview and medical record review, will help clarify likely etiology and guide treatment recommendations. Once data collection and interpretation have been completed, the findings / diagnosis and recommendations will be reviewed and discussed with the patient during a feedback appointment with the neuropsychologist. Based on the collaborative dialogue with the patient during the feedback, recommendations may be adjusted / tailored as needed. A formal report will be produced and provided to the patient and the referring provider.    FULL REPORT TO FOLLOW UPON COMPLETION OF TESTING.   Diagnosis: Cognitive changes  Per Hx: Mild cognitive impairment Cognitive decline Parkinsonian features      Evalene DOROTHA Riff, PsyD Clinical Neuropsychology [address] [place], [place] [address] Main: [phone] Fax: [phone]  This report was generated using voice recognition software. While this document has been carefully reviewed, transcription errors may be present. I apologize in advance for any inconvenience. Please contact me if further clarification is needed.

## 2023-12-30 NOTE — Progress Notes (Signed)
 Procedure Type: Isolated AVR Perioperative Outcome Estimate % Operative Mortality 0.606% Morbidity & Mortality 3.92% Stroke 0.958% Renal Failure 0.413% Reoperation 3.18% Prolonged Ventilation 1.37% Deep Sternal Wound Infection 0.035% Long Hospital Stay (>14 days) 1.45% Short Hospital Stay (<6 days)* 67.7%

## 2023-12-31 ENCOUNTER — Ambulatory Visit (HOSPITAL_COMMUNITY)
Admission: RE | Admit: 2023-12-31 | Discharge: 2023-12-31 | Disposition: A | Discharge status: Home / Self Care | Source: Ambulatory Visit | Attending: Internal Medicine | Admitting: Internal Medicine

## 2023-12-31 ENCOUNTER — Ambulatory Visit: Admitting: Physician Assistant

## 2023-12-31 ENCOUNTER — Ambulatory Visit: Payer: Self-pay | Admitting: Internal Medicine

## 2023-12-31 DIAGNOSIS — I350 Nonrheumatic aortic (valve) stenosis: Secondary | ICD-10-CM | POA: Diagnosis not present

## 2023-12-31 MED ORDER — IOHEXOL 350 MG/ML SOLN
95.0000 mL | Freq: Once | INTRAVENOUS | Status: AC | PRN
  Administered 2023-12-31: 100 mL via INTRAVENOUS

## 2024-01-01 ENCOUNTER — Telehealth: Payer: Self-pay | Admitting: Internal Medicine

## 2024-01-01 NOTE — Telephone Encounter (Signed)
 Slater with [facility] Radiology calling to report stat CTA Chest results.

## 2024-01-01 NOTE — Telephone Encounter (Signed)
 Spoke with Slater who wanted to make the provider aware of the following findings on chest portion of the CT scan:   3. A 9 mm right upper lobe noncalcified pulmonary nodule is present. Consider one of the following in 3 months for both low-risk and high-risk individuals: (a) repeat chest CT, (b) follow-up PET-CT, or (c) tissue sampling. This recommendation follows the consensus statement: Guidelines for Management of Incidental Pulmonary Nodules Detected on CT Images: From the Fleischner Society 2017; Radiology 2017; [phone]

## 2024-01-04 ENCOUNTER — Ambulatory Visit: Admitting: Mental Health

## 2024-01-06 ENCOUNTER — Encounter: Admitting: Surgery

## 2024-01-07 ENCOUNTER — Ambulatory Visit: Admitting: Psychiatry

## 2024-01-07 DIAGNOSIS — R69 Illness, unspecified: Secondary | ICD-10-CM

## 2024-01-07 DIAGNOSIS — F4323 Adjustment disorder with mixed anxiety and depressed mood: Secondary | ICD-10-CM

## 2024-01-07 DIAGNOSIS — G3184 Mild cognitive impairment, so stated: Secondary | ICD-10-CM

## 2024-01-07 NOTE — H&P (View-Only) (Signed)
 [name] [name] [id number]             [phone]      [name] [facility] Medical Record #[id number] Date of Birth: 11-20-1950  Referring: David Lurena POUR, MD Primary Care: David Lombard, MD Primary Cardiologist:[name] Lawrence, MD  Chief Complaint:    Chief Complaint  Patient presents with   Aortic Stenosis    TAVR consult, review all studies    History of Present Illness:     [name] is a 73 y.o. male presents for surgical evaluation of severe aortic stenosis.  He has been symptomatic with chest pain and shortness of breath.  His symptoms have progressed over the past several months.     Past Medical History:  Diagnosis Date   Cancer (HCC)    larygneal 2003, ssq L eye used interferon gtts   Gout    OSA (obstructive sleep apnea)    has inspire 2023   SVT (supraventricular tachycardia)    benign heart murmur    Past Surgical History:  Procedure Laterality Date   NO PAST SURGERIES     RIGHT HEART CATH AND CORONARY ANGIOGRAPHY N/A 12/15/2023   Procedure: RIGHT HEART CATH AND CORONARY ANGIOGRAPHY;  Surgeon: Berger Newman JINNY, MD;  Location: [facility];  Service: Cardiovascular;  Laterality: N/A;    Social History:  Social History   Tobacco Use  Smoking Status Never  Smokeless Tobacco Not on file    Social History   Substance and Sexual Activity  Alcohol Use No     No Known Allergies    Current Outpatient Medications  Medication Sig Dispense Refill   aspirin  EC 81 MG tablet Take 1 tablet (81 mg total) by mouth daily. Swallow whole. 90 tablet 3   Cholecalciferol (VITAMIN D-3) 125 MCG (5000 UT) TABS Take 5,000 Units by mouth daily.     Coenzyme Q10 200 MG capsule Take 200 mg by mouth daily.     cyanocobalamin (VITAMIN B12) 1000 MCG tablet Take 1,000 mcg by mouth daily.     memantine  (NAMENDA ) 10 MG tablet Take 10 mg by mouth 2 (two) times daily.     metoprolol  tartrate (LOPRESSOR )  100 MG tablet Take 1 tablet (100 mg total) by mouth once for 1 dose. Take 90-120 minutes prior to scan. 1 tablet 0   Multiple Vitamins-Minerals (MULTIVITAMIN WITH MINERALS) tablet Take 1 tablet by mouth daily.     Omega-3 Fatty Acids (FISH OIL PO) Take 400 mg by mouth daily.     rosuvastatin  (CRESTOR ) 20 MG tablet Take 1 tablet (20 mg total) by mouth daily. 90 tablet 3   No current facility-administered medications for this visit.    (Not in a hospital admission)   Family History  Problem Relation Age of Onset   Parkinson's disease Paternal Uncle      Review of Systems:   Review of Systems  Constitutional:  Positive for malaise/fatigue.  Respiratory:  Positive for shortness of breath.   Cardiovascular:  Positive for chest pain.  Neurological: Negative.       Physical Exam: BP 126/75 (BP Location: Right Arm, Patient Position: Sitting, Cuff Size: Normal)   Pulse 73   Resp 20   Ht 6' (1.829 m)   Wt 172 lb 9.6 oz (78.3 kg)   SpO2 98%   BMI 23.41 kg/m  Physical Exam Constitutional:      General: He  is not in acute distress.    Appearance: He is not ill-appearing.  HENT:     Head: Normocephalic and atraumatic.  Eyes:     Extraocular Movements: Extraocular movements intact.  Cardiovascular:     Rate and Rhythm: Normal rate.  Pulmonary:     Effort: Pulmonary effort is normal. No respiratory distress.  Abdominal:     General: Abdomen is flat. There is no distension.  Musculoskeletal:        General: Normal range of motion.     Cervical back: Normal range of motion.  Skin:    General: Skin is warm and dry.  Neurological:     General: No focal deficit present.     Mental Status: He is alert and oriented to person, place, and time.       Cardiac Studies & Procedures   ______________________________________________________________________________________________ CARDIAC CATHETERIZATION  CARDIAC CATHETERIZATION 12/15/2023  Conclusion Images from the original  result were not included. Coronary angiography 12/15/2023: LM: Distal calcific 40% stenosis LAD: Mid 40% disease with mild calcification Ostial diag 2 70% stenosis Lcx: Minimal luminal irregularities RCA: Dominant vessel. Mild diffuse disease Bifurcating RPDA with 90% ostial stenosis    Right heart catheterization 12/15/2023: RA: 6 mmHg RV: 31/1 mmHg PA: 24/9 mmHg, mPAP 16 mmHg PCW: 10 mmHg  AO sats: 94% PA sats: 77%  CO: 7.9 L/min CI: 4.2 L/min/m2  Conclusion: Moderate distal left main disease Moderate to severe LAD/RPDA disease Normal filling pressures  Recommendation: In absence of angina symptoms, reasonable to proceed with TAVR workup Needs aggressive medical management for CAD Added Aspirin  81 mg daily, Crestor  20 mg daily In future, if he does have angina symptoms on optimal medical therapy, could consider RPDA PCI with provisional stenting If LM needs stenting at any point, would prefer cross over stenting into LAD  Newman Berger Lawrence, MD  Findings Coronary Findings Diagnostic  Dominance: Right  Left Main Dist LM lesion is 40% stenosed. The lesion is moderately calcified.  Left Anterior Descending Mid LAD lesion is 40% stenosed. The lesion is mildly calcified.  Second Diagonal Branch 2nd Diag lesion is 70% stenosed.  Left Circumflex Vessel is normal in caliber. The vessel exhibits minimal luminal irregularities.  Right Coronary Artery There is mild diffuse disease throughout the vessel.  Right Posterior Descending Artery RPDA lesion is 90% stenosed.  Inferior Septal Inf Sept lesion is 90% stenosed.  Intervention  No interventions have been documented.     ECHOCARDIOGRAM  ECHOCARDIOGRAM COMPLETE 12/08/2023  Narrative ECHOCARDIOGRAM REPORT    Patient Name:   [name] Date of Exam: 12/08/2023 Medical Rec #:  [id number]       Height:       72.0 in Accession #:    [id number]      Weight:       170.6 lb Date of Birth:  [birth year]       BSA:          1.991 m Patient Age:    72 years        BP:           106/56 mmHg Patient Gender: M               HR:           91 bpm. Exam Location:  [address]  Procedure: 2D Echo, 3D Echo, Cardiac Doppler, Color Doppler and Strain Analysis (Both Spectral and Color Flow Doppler were utilized during procedure).  Indications:    I35.0 Aortic  Stenosis  History:        Patient has no prior history of Echocardiogram examinations. Aortic Valve Disease, Arrythmias:SVT, Signs/Symptoms:Murmur; Risk Factors:Sleep Apnea and Dyslipidemia. Severe Aortic Stenosis History of Layrngeal Carcinoma (2003), Esophageal Dysphagia, Cognitive Imparement (recent decline).  Sonographer:    Heather Hawks RDCS Referring Phys: ELMIRA PENMAN, J  IMPRESSIONS   1. Left ventricular ejection fraction, by estimation, is 70 to 75%. Left ventricular ejection fraction by 3D volume is 72 %. The left ventricle has hyperdynamic function. The left ventricle has no regional wall motion abnormalities. There is mild left ventricular hypertrophy. Left ventricular diastolic parameters were normal. The average left ventricular global longitudinal strain is -22.5 %. The global longitudinal strain is normal. 2. Right ventricular systolic function is hyperdynamic. The right ventricular size is normal. 3. Calcified chords, MR is only pronounced the the parasternal series. The mitral valve is degenerative. Moderate mitral valve regurgitation. 4. Highest gradient seen in the right upper sternal border. The aortic valve is calcified. Aortic valve regurgitation is not visualized. Severe aortic valve stenosis. Aortic valve mean gradient measures 40.0 mmHg. Aortic valve Vmax measures 4.46 m/s. Aortic valve acceleration time measures 98 msec.  Comparison(s): No prior Echocardiogram.  FINDINGS Left Ventricle: Left ventricular ejection fraction, by estimation, is 70 to 75%. Left ventricular ejection fraction by 3D volume is  72 %. The left ventricle has hyperdynamic function. The left ventricle has no regional wall motion abnormalities. The average left ventricular global longitudinal strain is -22.5 %. Strain was performed and the global longitudinal strain is normal. The left ventricular internal cavity size was normal in size. There is mild left ventricular hypertrophy. Left ventricular diastolic parameters were normal.  Right Ventricle: The right ventricular size is normal. No increase in right ventricular wall thickness. Right ventricular systolic function is hyperdynamic.  Left Atrium: Left atrial size was normal in size.  Right Atrium: Right atrial size was normal in size.  Pericardium: There is no evidence of pericardial effusion.  Mitral Valve: Calcified chords, MR is only pronounced the the parasternal series. The mitral valve is degenerative in appearance. Moderate mitral valve regurgitation.  Tricuspid Valve: The tricuspid valve is normal in structure. Tricuspid valve regurgitation is not demonstrated. No evidence of tricuspid stenosis.  Aortic Valve: Highest gradient seen in the right upper sternal border. The aortic valve is calcified. Aortic valve regurgitation is not visualized. Severe aortic stenosis is present. Aortic valve mean gradient measures 40.0 mmHg. Aortic valve peak gradient measures 79.6 mmHg. Aortic valve area, by VTI measures 1.17 cm.  Pulmonic Valve: The pulmonic valve was not well visualized. Pulmonic valve regurgitation is not visualized.  Aorta: The aortic root and ascending aorta are structurally normal, with no evidence of dilitation.  IAS/Shunts: The atrial septum is grossly normal.  Additional Comments: 3D was performed not requiring image post processing on an independent workstation and was normal.   LEFT VENTRICLE PLAX 2D LVIDd:         4.10 cm         Diastology LVIDs:         1.20 cm         LV e' medial:    7.34 cm/s LV PW:         0.90 cm         LV E/e'  medial:  12.2 LV IVS:        1.10 cm         LV e' lateral:   8.48 cm/s LVOT diam:  2.10 cm         LV E/e' lateral: 10.6 LV SV:         93 LV SV Index:   47              2D Longitudinal LVOT Area:     3.46 cm        Strain LV IVRT:       33 msec         2D Strain GLS   -23.0 % (A4C): 2D Strain GLS   -23.4 % (A3C): 2D Strain GLS   -21.1 % (A2C): 2D Strain GLS   -22.5 % Avg:  3D Volume EF LV 3D EF:    Left ventricul ar ejection fraction by 3D volume is 72 %.  3D Volume EF: 3D EF:        72 % LV EDV:       155 ml LV ESV:       44 ml LV SV:        111 ml  RIGHT VENTRICLE RV Basal diam:  3.20 cm     PULMONARY VEINS RV S prime:     17.90 cm/s  Diastolic Velocity: 58.80 cm/s TAPSE (M-mode): 3.2 cm      S/D Velocity:       1.20 RVSP:           26.2 mmHg   Systolic Velocity:  67.70 cm/s  LEFT ATRIUM             Index        RIGHT ATRIUM           Index LA diam:        4.40 cm 2.21 cm/m   RA Pressure: 3.00 mmHg LA Vol (A2C):   77.2 ml 38.77 ml/m  RA Area:     16.10 cm LA Vol (A4C):   48.6 ml 24.41 ml/m  RA Volume:   41.30 ml  20.74 ml/m LA Biplane Vol: 64.3 ml 32.29 ml/m AORTIC VALVE AV Area (Vmax):    1.10 cm AV Area (Vmean):   1.23 cm AV Area (VTI):     1.17 cm AV Vmax:           446.00 cm/s AV Vmean:          267.000 cm/s AV VTI:            0.795 m AV Peak Grad:      79.6 mmHg AV Mean Grad:      40.0 mmHg LVOT Vmax:         141.25 cm/s LVOT Vmean:        94.950 cm/s LVOT VTI:          0.269 m LVOT/AV VTI ratio: 0.34  AORTA Ao Root diam: 3.00 cm Ao Asc diam:  3.50 cm  MITRAL VALVE                 TRICUSPID VALVE MV Area (PHT): cm           TR Peak grad:   23.2 mmHg MV Decel Time: 243 msec      TR Vmax:        241.00 cm/s MR Peak grad:   165.9 mmHg   Estimated RAP:  3.00 mmHg MR Mean grad:   114.0 mmHg   RVSP:           26.2 mmHg MR Vmax:        644.00 cm/s MR Vmean:  514.0 cm/s   SHUNTS MR PISA:        1.18 cm     Systemic VTI:  0.27  m MR PISA Radius: 0.43 cm      Systemic Diam: 2.10 cm MV E velocity: 89.95 cm/s MV A velocity: 106.50 cm/s MV E/A ratio:  0.84  Stanly Leavens MD Electronically signed by Stanly Leavens MD Signature Date/Time: 12/08/2023/1:35:37 PM    Final      CT SCANS  CT CORONARY MORPH W/CTA COR W/SCORE 12/31/2023  Addendum 01/03/2024  5:00 PM ADDENDUM REPORT: 01/03/2024 16:58  EXAM: OVER-READ INTERPRETATION  CT CHEST  The following report is an over-read performed by radiologist Dr. Ree Levy [facility] on 01/03/2024. This over-read does not include interpretation of cardiac or coronary anatomy or pathology. The coronary CTA interpretation by the cardiologist is attached.  COMPARISON:  None.  FINDINGS: Mediastinum/Nodes: No solid / cystic mediastinal masses. The visualized esophagus is nondistended precluding optimal assessment. No thoracic lymphadenopathy by size criteria.  Lungs/Pleura: Imaged tracheo-bronchial tree is patent. There are dependent changes in bilateral lungs. No mass or consolidation. No pleural effusion or pneumothorax. No suspicious lung nodules.  Upper Abdomen: There is a 4.2 x 5.0 cm simple cyst in the left hepatic lobe. Remaining visualized upper abdominal viscera within normal limits.  Musculoskeletal: The visualized soft tissues of the chest wall are grossly unremarkable. No suspicious osseous lesions.  IMPRESSION: No acute or significant extracardiac findings.   Electronically Signed By: Ree Molt M.D. On: 01/03/2024 16:58  Narrative CLINICAL DATA:  44M with severe aortic stenosis being evaluated for a TAVR procedure.  EXAM: Cardiac TAVR CT  TECHNIQUE: A non-contrast, gated CT scan was obtained with axial slices of 2.5 mm through the heart for aortic valve scoring. A 120 kV retrospective, gated, contrast cardiac scan was obtained. Gantry rotation speed was 230 msec and collimation was 0.63  mm. Nitroglycerin  was not given. A delayed scan was obtained to exclude left atrial appendage thrombus. The 3D dataset was reconstructed in systole with motion correction. The 3D data set was reconstructed in 5% intervals of the 0-95% of the R-R cycle. Systolic and diastolic phases were analyzed on a dedicated workstation using MPR, MIP, and VRT modes. The patient received 100 cc of contrast.  FINDINGS: Aortic Root:  Aortic valve: Tricuspid  Aortic valve calcium  score: 1824  Aortic annulus:  Diameter: 31mm x 24mm  Perimeter: 84mm  Area: 578mm^2  Calcifications: No calcifications  Coronary height: Min Left - 15mm; Min Right - 15mm  Sinotubular height: Left cusp - 23mm; Right cusp - 25mm; Noncoronary cusp - 24mm  LVOT (as measured 3 mm below the annulus):  Diameter: 31mm x 23mm  Area: 571mm^2  Calcifications: No calcifications  Aortic sinus width: Left cusp - 32mm; Right cusp - 31mm; Noncoronary cusp - 31mm  Sinotubular junction width: 32mm x 29mm  Optimum Fluoroscopic Angle for Delivery: LAO 5 CAU 22  Cardiac:  Right atrium: Mild enlargement  Right ventricle: Mild dilatation  Pulmonary arteries: Normal size  Pulmonary veins: Normal configuration  Left atrium: Mild enlargement  Left ventricle: Normal size  Pericardium: Normal thickness  Coronary arteries: Coronary calcium  score 1640 (97th percentile)  IMPRESSION: 1. Tricuspid aortic valve with moderate to severe calcifications (AV calcium  score 1824)  2. Aortic annulus measures 31mm x 24mm in diameter with perimeter 84mm and area 543mm^2. No annular or LVOT calcifications. Annular measurements are on the border between a 26mm and 29mm Edwards Sapien 3 valve. Recommend discussion  at structural heart conference  3. Sufficient coronary to annulus distance.  4. Optimum Fluoroscopic Angle for Delivery:  LAO 5 CAU 22  5. Coronary calcium  score 1640 (97th percentile)  Electronically Signed: By:  Lonni Nanas M.D. On: 12/31/2023 15:43     ______________________________________________________________________________________________      ECG sinus    I have independently reviewed the above radiologic studies and discussed with the patient   Recent Lab Findings: Lab Results  Component Value Date   WBC 6.3 12/01/2023   HGB 12.2 (L) 12/15/2023   HCT 36.0 (L) 12/15/2023   PLT 238 12/01/2023   GLUCOSE 79 12/18/2023   ALT 16 09/23/2023   AST 28 09/23/2023   NA 140 12/18/2023   K 4.4 12/18/2023   CL 101 12/18/2023   CREATININE 0.78 12/18/2023   BUN 16 12/18/2023   CO2 27 12/18/2023   TSH 1.046 02/03/2006      Assessment / Plan:   73 y.o. male with severe aortic stenosis.  NYHA Class II.  The risks and benefits of transfemoral TAVR were discussed in detail.  We also discussed possibility of an emergent sternotomy to address any procedural complications.  Based on our discussion, we collectively decided that an emergent sternotomy would be indicated.  The patient is agreeable to proceed.  Based on my review of her LHC, echo, and CTA, I agree with the multidisciplinary plan to proceed with a transfermoral TAVR.      I  spent 40 minutes counseling the patient face to face.   Linnie [name] 01/08/2024 3:54 PM

## 2024-01-07 NOTE — Progress Notes (Signed)
 Psychotherapy Progress Note [facility] [name], PhD LP  Patient ID: [name] [facility])    MRN: [id number] Therapy format: Individual psychotherapy Date: 01/07/2024      Start: 2:15p     Stop: 3:00p     Time Spent: 45 min Location: In-person   Session narrative (presenting needs, interim history, self-report of stressors and symptoms, applications of prior therapy, status changes, and interventions made in session) CT done last week, EHR notes concerns for dementia and a nodule.  Gets results tomorrow, will be with [name].    C/o hand tremor spoiling his writing, general feeling like everything's going faster.  Been hearing his pulse in his ears for years, and tinnitus attacks.  W has been making notes to keep information straight.  He would like to, but hand tremor makes it harder.  Support/validation provided.   Walking often enough, with [name].  Tends to want her ahead of him so he can see irregularities better, but now she's outpacing him more easily, his stamina flagging and chest tightness coming on a little more readily.    Mornings tend to be an emotional battle, after devotions and heading into hours alone with no clear purpose.  Relates that he had neuropsych testing last week.  No recollection of any recommendations made.  Encourage to recover those if made, or verify if he will have a results meeting to come.  Therapeutic modalities: Cognitive Behavioral Therapy, Solution-Oriented/Positive Psychology, and Ego-Supportive  Mental Status/Observations:  Appearance:   Casual and pale     Behavior:  Appropriate  Motor:  Tremor  Speech/Language:   Husky voice, some delays  Affect:  Appropriate and blunted  Mood:  anxious and dysthymic  Thought process:  normal  Thought content:    WNL  Sensory/Perceptual disturbances:    WNL and Tinnitus  Orientation:  grossly intact  Attention:  Good    Concentration:  Fair  Memory:  grossly intact  Insight:     Good  Judgment:   Good  Impulse Control:  Good   Risk Assessment: Danger to Self: No Self-injurious Behavior: No Danger to Others: No Physical Aggression / Violence: No Duty to Warn: No Access to Firearms a concern: No  Assessment of progress:  stabilized  Diagnosis:   ICD-10-CM   1. Adjustment disorder with mixed anxiety and depressed mood  F43.23     2. Mild cognitive impairment (unconfirmed Parkinson's)  G31.84     3. r/o dysthymia, early onset  R69      Plan:  Mood -- Practice permission to be grieved by hardships, then pivot to available actions, and pivot from why to what/how questions in addressing them.  Maintain personal support and spiritual involvement as able.  Endorse opening up to adult children as well.  Available for [name] to join therapy if she sees fit, for further problem-solving and behavioral consultation adjusting to illness and treatment.   Lifestyle -- Maintain special and physical activity up to any medical limits.  If unclear, let heaviness and fatigue be his guide and most likely stick to walking, no running or strong swimming.  Encourage enjoying changes of scenery where able. Cognitive -- Defer assessment to neurology and potential neuropsychology if indicated.  Endorse continuing Namenda or appropriate alternative, and maintaining and/or strengthening supplementation with relevant B vitamins, D, and omega 3.  May look into ideas for cognitive stimulation and exercising executive function, word-finding, and any other affected faculties.  Option to ask about neuropsych testing and  about neuro rehab or OT referral through neurology if interested in specialty care for cognitive issues. Other recommendations/advice -- As may be noted above.  Continue to utilize previously learned skills ad lib. Medication compliance -- Maintain medication as prescribed and work faithfully with relevant prescriber(s) if any changes are desired or seem indicated. Crisis service  -- Aware of call list and work-in appts.  Call the clinic on-call service, 988/hotline, 911, or present to [facility] or ER if any life-threatening psychiatric crisis. Followup -- Return for time as already scheduled.  Next scheduled visit with me 02/08/2024.  Next scheduled in this office 02/08/2024.  [name], PhD [name], PhD LP Clinical Psychologist, [facility] [address], [address] [place], [place] [address] [phone]

## 2024-01-07 NOTE — Progress Notes (Unsigned)
 [address] [place] [address]             [phone]      [name] [facility] Medical Record #[id number] Date of Birth: 10/09/1950  Referring: David Lurena POUR, MD Primary Care: David Lombard, MD Primary Cardiologist:[name] Lawrence, MD  Chief Complaint:   No chief complaint on file.   History of Present Illness:     [name] is a 73 y.o. male presents for surgical evaluation of ***  Patient consents to use of AI scribe. The patient is 73 year old male with above listed medical problems referred for recommendations regarding his aortic stenosis.   He has been experiencing increased shortness of breath, a new symptom for him. Activities such as walking, which he previously did without difficulty, now cause him to feel short of breath. He has a history of a benign heart murmur diagnosed several years ago, which was recently re-evaluated and found to be more concerning. An echocardiogram in June showed findings that were reported as severe aortic stenosis. He underwent a heart catheterization earlier this month, which showed no severe blockages in the arteries.   He has a history of forgetfulness and underwent testing for Alzheimer's disease, which returned normal results. However, a DAT scan indicated Parkinson's disease, although formal cognitive testing is still pending for a definitive diagnosis. His wife reports that he has shown signs of cognitive decline over the past few years.   He does not experience issues with breathing while lying flat and denies lightheadedness when standing quickly.  He has reduced his physical activity, limiting himself to walking and stretching. He feels more fatigued during activities such as vacuuming, which he could previously complete without issue.     Past Medical History:  Diagnosis Date   Cancer (HCC)    larygneal 2003, ssq L eye used interferon gtts   Gout    OSA (obstructive sleep  apnea)    has inspire 2023   SVT (supraventricular tachycardia)    benign heart murmur    Past Surgical History:  Procedure Laterality Date   NO PAST SURGERIES     RIGHT HEART CATH AND CORONARY ANGIOGRAPHY N/A 12/15/2023   Procedure: RIGHT HEART CATH AND CORONARY ANGIOGRAPHY;  Surgeon: Berger Newman JINNY, MD;  Location: [facility] INVASIVE CV [facility];  Service: Cardiovascular;  Laterality: N/A;    Social History:  Social History   Tobacco Use  Smoking Status Never  Smokeless Tobacco Not on file    Social History   Substance and Sexual Activity  Alcohol Use No     No Known Allergies    Current Outpatient Medications  Medication Sig Dispense Refill   aspirin EC 81 MG tablet Take 1 tablet (81 mg total) by mouth daily. Swallow whole. 90 tablet 3   Cholecalciferol (VITAMIN D-3) 125 MCG (5000 UT) TABS Take 5,000 Units by mouth daily.     Coenzyme Q10 200 MG capsule Take 200 mg by mouth daily.     cyanocobalamin (VITAMIN B12) 1000 MCG tablet Take 1,000 mcg by mouth daily.     memantine (NAMENDA) 10 MG tablet Take 10 mg by mouth 2 (two) times daily.     metoprolol tartrate (LOPRESSOR) 100 MG tablet Take 1 tablet (100 mg total) by mouth once for 1 dose. Take 90-120 minutes prior to scan. 1 tablet 0   Multiple Vitamins-Minerals (MULTIVITAMIN WITH MINERALS) tablet Take 1 tablet by mouth daily.  Omega-3 Fatty Acids (FISH OIL PO) Take 400 mg by mouth daily.     rosuvastatin (CRESTOR) 20 MG tablet Take 1 tablet (20 mg total) by mouth daily. 90 tablet 3   No current facility-administered medications for this visit.    (Not in a hospital admission)   Family History  Problem Relation Age of Onset   Parkinson's disease Paternal Uncle      Review of Systems:   ROS    Physical Exam: There were no vitals taken for this visit. Physical Exam    Cardiac Studies & Procedures   ______________________________________________________________________________________________ CARDIAC  CATHETERIZATION  CARDIAC CATHETERIZATION 12/15/2023  Conclusion Images from the original result were not included. Coronary angiography 12/15/2023: LM: Distal calcific 40% stenosis LAD: Mid 40% disease with mild calcification Ostial diag 2 70% stenosis Lcx: Minimal luminal irregularities RCA: Dominant vessel. Mild diffuse disease Bifurcating RPDA with 90% ostial stenosis    Right heart catheterization 12/15/2023: RA: 6 mmHg RV: 31/1 mmHg PA: 24/9 mmHg, mPAP 16 mmHg PCW: 10 mmHg  AO sats: 94% PA sats: 77%  CO: 7.9 L/min CI: 4.2 L/min/m2  Conclusion: Moderate distal left main disease Moderate to severe LAD/RPDA disease Normal filling pressures  Recommendation: In absence of angina symptoms, reasonable to proceed with TAVR workup Needs aggressive medical management for CAD Added Aspirin 81 mg daily, Crestor 20 mg daily In future, if he does have angina symptoms on optimal medical therapy, could consider RPDA PCI with provisional stenting If LM needs stenting at any point, would prefer cross over stenting into LAD  Newman Berger Lawrence, MD  Findings Coronary Findings Diagnostic  Dominance: Right  Left Main Dist LM lesion is 40% stenosed. The lesion is moderately calcified.  Left Anterior Descending Mid LAD lesion is 40% stenosed. The lesion is mildly calcified.  Second Diagonal Branch 2nd Diag lesion is 70% stenosed.  Left Circumflex Vessel is normal in caliber. The vessel exhibits minimal luminal irregularities.  Right Coronary Artery There is mild diffuse disease throughout the vessel.  Right Posterior Descending Artery RPDA lesion is 90% stenosed.  Inferior Septal Inf Sept lesion is 90% stenosed.  Intervention  No interventions have been documented.     ECHOCARDIOGRAM  ECHOCARDIOGRAM COMPLETE 12/08/2023  Narrative ECHOCARDIOGRAM REPORT    Patient Name:   [name] Date of Exam: 12/08/2023 Medical Rec #:  [id number]       Height:        72.0 in Accession #:    [id number]      Weight:       170.6 lb Date of Birth:  1950-11-01      BSA:          1.991 m Patient Age:    72 years        BP:           106/56 mmHg Patient Gender: M               HR:           91 bpm. Exam Location:  [address]  Procedure: 2D Echo, 3D Echo, Cardiac Doppler, Color Doppler and Strain Analysis (Both Spectral and Color Flow Doppler were utilized during procedure).  Indications:    I35.0 Aortic Stenosis  History:        Patient has no prior history of Echocardiogram examinations. Aortic Valve Disease, Arrythmias:SVT, Signs/Symptoms:Murmur; Risk Factors:Sleep Apnea and Dyslipidemia. Severe Aortic Stenosis History of Layrngeal Carcinoma (2003), Esophageal Dysphagia, Cognitive Imparement (recent decline).  Sonographer:  Heather Hawks RDCS Referring Phys: ELMIRA PENMAN, J  IMPRESSIONS   1. Left ventricular ejection fraction, by estimation, is 70 to 75%. Left ventricular ejection fraction by 3D volume is 72 %. The left ventricle has hyperdynamic function. The left ventricle has no regional wall motion abnormalities. There is mild left ventricular hypertrophy. Left ventricular diastolic parameters were normal. The average left ventricular global longitudinal strain is -22.5 %. The global longitudinal strain is normal. 2. Right ventricular systolic function is hyperdynamic. The right ventricular size is normal. 3. Calcified chords, MR is only pronounced the the parasternal series. The mitral valve is degenerative. Moderate mitral valve regurgitation. 4. Highest gradient seen in the right upper sternal border. The aortic valve is calcified. Aortic valve regurgitation is not visualized. Severe aortic valve stenosis. Aortic valve mean gradient measures 40.0 mmHg. Aortic valve Vmax measures 4.46 m/s. Aortic valve acceleration time measures 98 msec.  Comparison(s): No prior Echocardiogram.  FINDINGS Left Ventricle: Left ventricular  ejection fraction, by estimation, is 70 to 75%. Left ventricular ejection fraction by 3D volume is 72 %. The left ventricle has hyperdynamic function. The left ventricle has no regional wall motion abnormalities. The average left ventricular global longitudinal strain is -22.5 %. Strain was performed and the global longitudinal strain is normal. The left ventricular internal cavity size was normal in size. There is mild left ventricular hypertrophy. Left ventricular diastolic parameters were normal.  Right Ventricle: The right ventricular size is normal. No increase in right ventricular wall thickness. Right ventricular systolic function is hyperdynamic.  Left Atrium: Left atrial size was normal in size.  Right Atrium: Right atrial size was normal in size.  Pericardium: There is no evidence of pericardial effusion.  Mitral Valve: Calcified chords, MR is only pronounced the the parasternal series. The mitral valve is degenerative in appearance. Moderate mitral valve regurgitation.  Tricuspid Valve: The tricuspid valve is normal in structure. Tricuspid valve regurgitation is not demonstrated. No evidence of tricuspid stenosis.  Aortic Valve: Highest gradient seen in the right upper sternal border. The aortic valve is calcified. Aortic valve regurgitation is not visualized. Severe aortic stenosis is present. Aortic valve mean gradient measures 40.0 mmHg. Aortic valve peak gradient measures 79.6 mmHg. Aortic valve area, by VTI measures 1.17 cm.  Pulmonic Valve: The pulmonic valve was not well visualized. Pulmonic valve regurgitation is not visualized.  Aorta: The aortic root and ascending aorta are structurally normal, with no evidence of dilitation.  IAS/Shunts: The atrial septum is grossly normal.  Additional Comments: 3D was performed not requiring image post processing on an independent workstation and was normal.   LEFT VENTRICLE PLAX 2D LVIDd:         4.10 cm          Diastology LVIDs:         1.20 cm         LV e' medial:    7.34 cm/s LV PW:         0.90 cm         LV E/e' medial:  12.2 LV IVS:        1.10 cm         LV e' lateral:   8.48 cm/s LVOT diam:     2.10 cm         LV E/e' lateral: 10.6 LV SV:         93 LV SV Index:   47  2D Longitudinal LVOT Area:     3.46 cm        Strain LV IVRT:       33 msec         2D Strain GLS   -23.0 % (A4C): 2D Strain GLS   -23.4 % (A3C): 2D Strain GLS   -21.1 % (A2C): 2D Strain GLS   -22.5 % Avg:  3D Volume EF LV 3D EF:    Left ventricul ar ejection fraction by 3D volume is 72 %.  3D Volume EF: 3D EF:        72 % LV EDV:       155 ml LV ESV:       44 ml LV SV:        111 ml  RIGHT VENTRICLE RV Basal diam:  3.20 cm     PULMONARY VEINS RV S prime:     17.90 cm/s  Diastolic Velocity: 58.80 cm/s TAPSE (M-mode): 3.2 cm      S/D Velocity:       1.20 RVSP:           26.2 mmHg   Systolic Velocity:  67.70 cm/s  LEFT ATRIUM             Index        RIGHT ATRIUM           Index LA diam:        4.40 cm 2.21 cm/m   RA Pressure: 3.00 mmHg LA Vol (A2C):   77.2 ml 38.77 ml/m  RA Area:     16.10 cm LA Vol (A4C):   48.6 ml 24.41 ml/m  RA Volume:   41.30 ml  20.74 ml/m LA Biplane Vol: 64.3 ml 32.29 ml/m AORTIC VALVE AV Area (Vmax):    1.10 cm AV Area (Vmean):   1.23 cm AV Area (VTI):     1.17 cm AV Vmax:           446.00 cm/s AV Vmean:          267.000 cm/s AV VTI:            0.795 m AV Peak Grad:      79.6 mmHg AV Mean Grad:      40.0 mmHg LVOT Vmax:         141.25 cm/s LVOT Vmean:        94.950 cm/s LVOT VTI:          0.269 m LVOT/AV VTI ratio: 0.34  AORTA Ao Root diam: 3.00 cm Ao Asc diam:  3.50 cm  MITRAL VALVE                 TRICUSPID VALVE MV Area (PHT): cm           TR Peak grad:   23.2 mmHg MV Decel Time: 243 msec      TR Vmax:        241.00 cm/s MR Peak grad:   165.9 mmHg   Estimated RAP:  3.00 mmHg MR Mean grad:   114.0 mmHg   RVSP:           26.2 mmHg MR Vmax:         644.00 cm/s MR Vmean:       514.0 cm/s   SHUNTS MR PISA:        1.18 cm     Systemic VTI:  0.27 m MR PISA Radius: 0.43 cm      Systemic Diam: 2.10 cm  MV E velocity: 89.95 cm/s MV A velocity: 106.50 cm/s MV E/A ratio:  0.84  Stanly Leavens MD Electronically signed by Stanly Leavens MD Signature Date/Time: 12/08/2023/1:35:37 PM    Final      CT SCANS  CT CORONARY MORPH W/CTA COR W/SCORE 12/31/2023  Addendum 01/03/2024  5:00 PM ADDENDUM REPORT: 01/03/2024 16:58  EXAM: OVER-READ INTERPRETATION  CT CHEST  The following report is an over-read performed by radiologist Dr. Ree Levy [facility] on 01/03/2024. This over-read does not include interpretation of cardiac or coronary anatomy or pathology. The coronary CTA interpretation by the cardiologist is attached.  COMPARISON:  None.  FINDINGS: Mediastinum/Nodes: No solid / cystic mediastinal masses. The visualized esophagus is nondistended precluding optimal assessment. No thoracic lymphadenopathy by size criteria.  Lungs/Pleura: Imaged tracheo-bronchial tree is patent. There are dependent changes in bilateral lungs. No mass or consolidation. No pleural effusion or pneumothorax. No suspicious lung nodules.  Upper Abdomen: There is a 4.2 x 5.0 cm simple cyst in the left hepatic lobe. Remaining visualized upper abdominal viscera within normal limits.  Musculoskeletal: The visualized soft tissues of the chest wall are grossly unremarkable. No suspicious osseous lesions.  IMPRESSION: No acute or significant extracardiac findings.   Electronically Signed By: Ree Molt M.D. On: 01/03/2024 16:58  Narrative CLINICAL DATA:  8M with severe aortic stenosis being evaluated for a TAVR procedure.  EXAM: Cardiac TAVR CT  TECHNIQUE: A non-contrast, gated CT scan was obtained with axial slices of 2.5 mm through the heart for aortic valve scoring. A 120 kV retrospective, gated,  contrast cardiac scan was obtained. Gantry rotation speed was 230 msec and collimation was 0.63 mm. Nitroglycerin was not given. A delayed scan was obtained to exclude left atrial appendage thrombus. The 3D dataset was reconstructed in systole with motion correction. The 3D data set was reconstructed in 5% intervals of the 0-95% of the R-R cycle. Systolic and diastolic phases were analyzed on a dedicated workstation using MPR, MIP, and VRT modes. The patient received 100 cc of contrast.  FINDINGS: Aortic Root:  Aortic valve: Tricuspid  Aortic valve calcium score: 1824  Aortic annulus:  Diameter: 31mm x 24mm  Perimeter: 84mm  Area: 565mm^2  Calcifications: No calcifications  Coronary height: Min Left - 15mm; Min Right - 15mm  Sinotubular height: Left cusp - 23mm; Right cusp - 25mm; Noncoronary cusp - 24mm  LVOT (as measured 3 mm below the annulus):  Diameter: 31mm x 23mm  Area: 514mm^2  Calcifications: No calcifications  Aortic sinus width: Left cusp - 32mm; Right cusp - 31mm; Noncoronary cusp - 31mm  Sinotubular junction width: 32mm x 29mm  Optimum Fluoroscopic Angle for Delivery: LAO 5 CAU 22  Cardiac:  Right atrium: Mild enlargement  Right ventricle: Mild dilatation  Pulmonary arteries: Normal size  Pulmonary veins: Normal configuration  Left atrium: Mild enlargement  Left ventricle: Normal size  Pericardium: Normal thickness  Coronary arteries: Coronary calcium score 1640 (97th percentile)  IMPRESSION: 1. 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 Aortic annulus measures 31mm x 24mm in diameter with perimeter 84mm and area 599mm^2. No annular or LVOT calcifications. Annular measurements are on the border between a 26mm and 29mm Edwards Sapien 3 valve. Recommend discussion at structural heart conference  3. Sufficient coronary to annulus distance.  4. Optimum Fluoroscopic Angle for Delivery:   LAO 5 CAU 22  5. Coronary calcium score 1640 (97th percentile)  Electronically Signed: By: Lonni Kate HERO.D.  On: 12/31/2023 15:43     ______________________________________________________________________________________________      ECG ***    I have independently reviewed the above radiologic studies and discussed with the patient   Recent Lab Findings: Lab Results  Component Value Date   WBC 6.3 12/01/2023   HGB 12.2 (L) 12/15/2023   HCT 36.0 (L) 12/15/2023   PLT 238 12/01/2023   GLUCOSE 79 12/18/2023   ALT 16 09/23/2023   AST 28 09/23/2023   NA 140 12/18/2023   K 4.4 12/18/2023   CL 101 12/18/2023   CREATININE 0.78 12/18/2023   BUN 16 12/18/2023   CO2 27 12/18/2023   TSH 1.046 02/03/2006      Assessment / Plan:   74 y.o. male with severe aortic stenosis.  STS score: ***.  NYHA Class ***.  The risks and benefits of *** TAVR were discussed in detail.  We also discussed possibility of an emergent sternotomy to address any procedural complications.  Based on our discussion, we collectively decided that an emergent sternotomy would *** be indicated.  The patient is *** agreeable to proceed.  Based on my review of her LHC, echo, and CTA, I agree with the multidisciplinary plan to proceed with a *** TAVR.      I  spent {CHL ONC TIME VISIT - DTPQU:[id number]} counseling the patient face to face.   Linnie [name] 01/07/2024 3:24 PM

## 2024-01-08 ENCOUNTER — Other Ambulatory Visit (HOSPITAL_COMMUNITY)

## 2024-01-08 ENCOUNTER — Ambulatory Visit
Attending: Thoracic Surgery (Cardiothoracic Vascular Surgery) | Admitting: Thoracic Surgery (Cardiothoracic Vascular Surgery)

## 2024-01-08 VITALS — BP 126/75 | HR 73 | Resp 20 | Ht 72.0 in | Wt 172.6 lb

## 2024-01-08 DIAGNOSIS — I350 Nonrheumatic aortic (valve) stenosis: Secondary | ICD-10-CM | POA: Diagnosis not present

## 2024-01-14 ENCOUNTER — Other Ambulatory Visit: Payer: Self-pay

## 2024-01-14 DIAGNOSIS — I350 Nonrheumatic aortic (valve) stenosis: Secondary | ICD-10-CM

## 2024-01-22 ENCOUNTER — Encounter (HOSPITAL_COMMUNITY)
Admission: RE | Admit: 2024-01-22 | Discharge: 2024-01-22 | Disposition: A | Discharge status: Home / Self Care | Source: Ambulatory Visit | Attending: Cardiovascular Disease | Admitting: Cardiovascular Disease

## 2024-01-22 ENCOUNTER — Ambulatory Visit (HOSPITAL_COMMUNITY)
Admission: RE | Admit: 2024-01-22 | Discharge: 2024-01-22 | Disposition: A | Discharge status: Home / Self Care | Source: Ambulatory Visit | Attending: Cardiovascular Disease | Admitting: Cardiovascular Disease

## 2024-01-22 ENCOUNTER — Other Ambulatory Visit: Payer: Self-pay

## 2024-01-22 ENCOUNTER — Other Ambulatory Visit (HOSPITAL_COMMUNITY)

## 2024-01-22 DIAGNOSIS — I350 Nonrheumatic aortic (valve) stenosis: Secondary | ICD-10-CM | POA: Diagnosis present

## 2024-01-22 DIAGNOSIS — Z01818 Encounter for other preprocedural examination: Secondary | ICD-10-CM

## 2024-01-22 LAB — TYPE AND SCREEN
ABO/RH(D): O POS
Antibody Screen: NEGATIVE

## 2024-01-22 LAB — CBC
HCT: 43.1 % (ref 39.0–52.0)
Hemoglobin: 14.6 g/dL (ref 13.0–17.0)
MCH: 33.1 pg (ref 26.0–34.0)
MCHC: 33.9 g/dL (ref 30.0–36.0)
MCV: 97.7 fL (ref 80.0–100.0)
Platelets: 198 K/uL (ref 150–400)
RBC: 4.41 MIL/uL (ref 4.22–5.81)
RDW: 12.5 % (ref 11.5–15.5)
WBC: 5.8 K/uL (ref 4.0–10.5)
nRBC: 0 % (ref 0.0–0.2)

## 2024-01-22 LAB — URINALYSIS, ROUTINE W REFLEX MICROSCOPIC
Bacteria, UA: NONE SEEN
Bilirubin Urine: NEGATIVE
Glucose, UA: NEGATIVE mg/dL
Ketones, ur: NEGATIVE mg/dL
Leukocytes,Ua: NEGATIVE
Nitrite: NEGATIVE
Protein, ur: NEGATIVE mg/dL
Specific Gravity, Urine: 1.011 (ref 1.005–1.030)
pH: 6 (ref 5.0–8.0)

## 2024-01-22 LAB — COMPREHENSIVE METABOLIC PANEL WITH GFR
ALT: 24 U/L (ref 0–44)
AST: 30 U/L (ref 15–41)
Albumin: 4.1 g/dL (ref 3.5–5.0)
Alkaline Phosphatase: 74 U/L (ref 38–126)
Anion gap: 10 (ref 5–15)
BUN: 14 mg/dL (ref 8–23)
CO2: 29 mmol/L (ref 22–32)
Calcium: 9.1 mg/dL (ref 8.9–10.3)
Chloride: 100 mmol/L (ref 98–111)
Creatinine, Ser: 0.7 mg/dL (ref 0.61–1.24)
GFR, Estimated: >60 mL/min (ref >60)
Glucose, Bld: 149 mg/dL — ABNORMAL HIGH (ref 70–99)
Potassium: 3.5 mmol/L (ref 3.5–5.1)
Sodium: 139 mmol/L (ref 135–145)
Total Bilirubin: 0.9 mg/dL (ref 0.0–1.2)
Total Protein: 6.8 g/dL (ref 6.5–8.1)

## 2024-01-22 LAB — PROTIME-INR
INR: 1 (ref 0.8–1.2)
Prothrombin Time: 14.1 s (ref 11.4–15.2)

## 2024-01-22 LAB — SURGICAL PCR SCREEN
MRSA, PCR: NEGATIVE
Staphylococcus aureus: NEGATIVE

## 2024-01-22 NOTE — Progress Notes (Signed)
 All consents signed by patient at PAT lab appointment. Pt was sent home with printed copy of surgical instructions and CHG soap/CHG soap instructions. All instructions reviewed with patient and patients wife, [name], and questions answered.  Patients chart send to anesthesia for review. Pt denies any respiratory illness/infection in the last two months.

## 2024-01-25 MED ORDER — DEXMEDETOMIDINE HCL IN NACL 400 MCG/100ML IV SOLN
0.1000 ug/kg/h | INTRAVENOUS | Status: AC
  Administered 2024-01-26: 78.32 ug via INTRAVENOUS
  Administered 2024-01-26: 1 ug/kg/h via INTRAVENOUS
  Filled 2024-01-25: qty 100

## 2024-01-25 MED ORDER — MAGNESIUM SULFATE 50 % IJ SOLN
40.0000 meq | INTRAMUSCULAR | Status: DC
  Filled 2024-01-25: qty 9.85

## 2024-01-25 MED ORDER — HEPARIN 30,000 UNITS/1000 ML (OHS) CELLSAVER SOLUTION
Status: DC
  Filled 2024-01-25: qty 1000

## 2024-01-25 MED ORDER — POTASSIUM CHLORIDE 2 MEQ/ML IV SOLN
80.0000 meq | INTRAVENOUS | Status: DC
  Filled 2024-01-25: qty 40

## 2024-01-25 MED ORDER — NOREPINEPHRINE 4 MG/250ML-% IV SOLN
0.0000 ug/min | INTRAVENOUS | Status: DC
  Filled 2024-01-25: qty 250

## 2024-01-25 MED ORDER — CEFAZOLIN SODIUM-DEXTROSE 2-4 GM/100ML-% IV SOLN
2.0000 g | INTRAVENOUS | Status: AC
  Administered 2024-01-26: 2 g via INTRAVENOUS
  Filled 2024-01-25: qty 100

## 2024-01-26 ENCOUNTER — Encounter (HOSPITAL_COMMUNITY): Payer: Self-pay | Admitting: Cardiovascular Disease

## 2024-01-26 ENCOUNTER — Inpatient Hospital Stay (HOSPITAL_COMMUNITY): Admitting: Certified Registered Nurse Anesthetist

## 2024-01-26 ENCOUNTER — Other Ambulatory Visit: Payer: Self-pay

## 2024-01-26 ENCOUNTER — Inpatient Hospital Stay (HOSPITAL_COMMUNITY): Payer: Self-pay | Admitting: Physician Assistant

## 2024-01-26 ENCOUNTER — Encounter (HOSPITAL_COMMUNITY)
Admission: RE | Disposition: A | Discharge status: Home / Self Care | Payer: Self-pay | Source: Home / Self Care | Attending: Thoracic Surgery (Cardiothoracic Vascular Surgery)

## 2024-01-26 ENCOUNTER — Inpatient Hospital Stay (HOSPITAL_COMMUNITY)

## 2024-01-26 ENCOUNTER — Inpatient Hospital Stay (HOSPITAL_COMMUNITY)
Admission: RE | Admit: 2024-01-26 | Discharge: 2024-01-27 | DRG: 267 | Disposition: A | Discharge status: Home / Self Care | Attending: Thoracic Surgery (Cardiothoracic Vascular Surgery) | Admitting: Thoracic Surgery (Cardiothoracic Vascular Surgery)

## 2024-01-26 DIAGNOSIS — I350 Nonrheumatic aortic (valve) stenosis: Secondary | ICD-10-CM | POA: Diagnosis not present

## 2024-01-26 DIAGNOSIS — Z006 Encounter for examination for normal comparison and control in clinical research program: Secondary | ICD-10-CM | POA: Diagnosis not present

## 2024-01-26 DIAGNOSIS — E7800 Pure hypercholesterolemia, unspecified: Secondary | ICD-10-CM | POA: Diagnosis present

## 2024-01-26 DIAGNOSIS — E785 Hyperlipidemia, unspecified: Secondary | ICD-10-CM | POA: Diagnosis present

## 2024-01-26 DIAGNOSIS — B351 Tinea unguium: Secondary | ICD-10-CM | POA: Diagnosis present

## 2024-01-26 DIAGNOSIS — G4733 Obstructive sleep apnea (adult) (pediatric): Secondary | ICD-10-CM | POA: Diagnosis present

## 2024-01-26 DIAGNOSIS — Z952 Presence of prosthetic heart valve: Principal | ICD-10-CM

## 2024-01-26 DIAGNOSIS — Z7982 Long term (current) use of aspirin: Secondary | ICD-10-CM

## 2024-01-26 DIAGNOSIS — G3184 Mild cognitive impairment, so stated: Secondary | ICD-10-CM | POA: Diagnosis present

## 2024-01-26 DIAGNOSIS — R911 Solitary pulmonary nodule: Secondary | ICD-10-CM | POA: Diagnosis present

## 2024-01-26 DIAGNOSIS — Z820 Family history of epilepsy and other diseases of the nervous system: Secondary | ICD-10-CM

## 2024-01-26 DIAGNOSIS — G20A1 Parkinson's disease without dyskinesia, without mention of fluctuations: Secondary | ICD-10-CM | POA: Diagnosis present

## 2024-01-26 DIAGNOSIS — I493 Ventricular premature depolarization: Secondary | ICD-10-CM | POA: Diagnosis present

## 2024-01-26 DIAGNOSIS — I2510 Atherosclerotic heart disease of native coronary artery without angina pectoris: Secondary | ICD-10-CM | POA: Diagnosis present

## 2024-01-26 DIAGNOSIS — Z8521 Personal history of malignant neoplasm of larynx: Secondary | ICD-10-CM

## 2024-01-26 DIAGNOSIS — Z79899 Other long term (current) drug therapy: Secondary | ICD-10-CM

## 2024-01-26 HISTORY — PX: INTRAOPERATIVE TRANSTHORACIC ECHOCARDIOGRAM: SHX6523

## 2024-01-26 LAB — POCT I-STAT, CHEM 8
BUN: 12 mg/dL (ref 8–23)
BUN: 12 mg/dL (ref 8–23)
Calcium, Ion: 1.16 mmol/L (ref 1.15–1.40)
Calcium, Ion: 1.22 mmol/L (ref 1.15–1.40)
Chloride: 104 mmol/L (ref 98–111)
Chloride: 102 mmol/L (ref 98–111)
Creatinine, Ser: 0.7 mg/dL (ref 0.61–1.24)
Creatinine, Ser: 0.6 mg/dL — ABNORMAL LOW (ref 0.61–1.24)
Glucose, Bld: 126 mg/dL — ABNORMAL HIGH (ref 70–99)
Glucose, Bld: 121 mg/dL — ABNORMAL HIGH (ref 70–99)
HCT: 33 % — ABNORMAL LOW (ref 39.0–52.0)
HCT: 33 % — ABNORMAL LOW (ref 39.0–52.0)
Hemoglobin: 11.2 g/dL — ABNORMAL LOW (ref 13.0–17.0)
Hemoglobin: 11.2 g/dL — ABNORMAL LOW (ref 13.0–17.0)
Potassium: 4 mmol/L (ref 3.5–5.1)
Potassium: 3.9 mmol/L (ref 3.5–5.1)
Sodium: 140 mmol/L (ref 135–145)
Sodium: 141 mmol/L (ref 135–145)
TCO2: 26 mmol/L (ref 22–32)
TCO2: 28 mmol/L (ref 22–32)

## 2024-01-26 LAB — ECHOCARDIOGRAM LIMITED
AR max vel: 4.3 cm2
AV Area VTI: 4.01 cm2
AV Area mean vel: 4.1 cm2
AV Mean grad: 3 mmHg
AV Peak grad: 5.4 mmHg
Ao pk vel: 1.16 m/s

## 2024-01-26 LAB — ABO/RH: ABO/RH(D): O POS

## 2024-01-26 SURGERY — TRANSCATHETER AORTIC VALVE REPLACEMENT, TRANSFEMORAL (CATHLAB)
Anesthesia: Monitor Anesthesia Care | Laterality: Right

## 2024-01-26 MED ORDER — PROTAMINE SULFATE 10 MG/ML IV SOLN
INTRAVENOUS | Status: DC | PRN
  Administered 2024-01-26: 120 mg via INTRAVENOUS

## 2024-01-26 MED ORDER — NITROGLYCERIN IN D5W 200-5 MCG/ML-% IV SOLN: 0.0000 ug/min | INTRAVENOUS | Status: DC

## 2024-01-26 MED ORDER — SODIUM CHLORIDE 0.9% FLUSH
3.0000 mL | Freq: Two times a day (BID) | INTRAVENOUS | Status: DC
  Administered 2024-01-27: 3 mL via INTRAVENOUS

## 2024-01-26 MED ORDER — ACETAMINOPHEN 650 MG RE SUPP: 650.0000 mg | Freq: Four times a day (QID) | RECTAL | Status: DC | PRN

## 2024-01-26 MED ORDER — FENTANYL CITRATE (PF) 100 MCG/2ML IJ SOLN
INTRAMUSCULAR | Status: AC
  Filled 2024-01-26: qty 2

## 2024-01-26 MED ORDER — SODIUM CHLORIDE 0.9 % IV SOLN: 250.0000 mL | INTRAVENOUS | Status: DC | PRN

## 2024-01-26 MED ORDER — MEMANTINE HCL 10 MG PO TABS
10.0000 mg | ORAL_TABLET | Freq: Two times a day (BID) | ORAL | Status: DC
  Administered 2024-01-26 – 2024-01-27 (×2): 10 mg via ORAL
  Filled 2024-01-26 (×2): qty 1

## 2024-01-26 MED ORDER — MORPHINE SULFATE (PF) 2 MG/ML IV SOLN: 1.0000 mg | INTRAVENOUS | Status: DC | PRN

## 2024-01-26 MED ORDER — CHLORHEXIDINE GLUCONATE 4 % EX SOLN: 60.0000 mL | Freq: Once | CUTANEOUS | Status: DC

## 2024-01-26 MED ORDER — CEFAZOLIN SODIUM-DEXTROSE 2-4 GM/100ML-% IV SOLN
2.0000 g | Freq: Three times a day (TID) | INTRAVENOUS | Status: AC
  Administered 2024-01-26 (×2): 2 g via INTRAVENOUS
  Filled 2024-01-26 (×2): qty 100

## 2024-01-26 MED ORDER — ONDANSETRON HCL 4 MG/2ML IJ SOLN
INTRAMUSCULAR | Status: DC | PRN
  Administered 2024-01-26: 4 mg via INTRAVENOUS

## 2024-01-26 MED ORDER — LACTATED RINGERS IV SOLN: INTRAVENOUS | Status: DC

## 2024-01-26 MED ORDER — ACETAMINOPHEN 325 MG PO TABS
650.0000 mg | ORAL_TABLET | Freq: Four times a day (QID) | ORAL | Status: DC | PRN
  Administered 2024-01-27: 650 mg via ORAL
  Filled 2024-01-26: qty 2

## 2024-01-26 MED ORDER — HEPARIN SODIUM (PORCINE) 1000 UNIT/ML IJ SOLN
INTRAMUSCULAR | Status: DC | PRN
  Administered 2024-01-26: 12000 [IU] via INTRAVENOUS

## 2024-01-26 MED ORDER — CHLORHEXIDINE GLUCONATE 0.12 % MT SOLN
15.0000 mL | Freq: Once | OROMUCOSAL | Status: AC
  Administered 2024-01-26: 15 mL via OROMUCOSAL
  Filled 2024-01-26: qty 15

## 2024-01-26 MED ORDER — LIDOCAINE HCL (PF) 1 % IJ SOLN
INTRAMUSCULAR | Status: DC | PRN
Start: 2024-01-26 — End: 2024-01-26
  Administered 2024-01-26 (×2): 10 mL

## 2024-01-26 MED ORDER — ROSUVASTATIN CALCIUM 20 MG PO TABS
20.0000 mg | ORAL_TABLET | Freq: Every day | ORAL | Status: DC
  Administered 2024-01-27: 20 mg via ORAL
  Filled 2024-01-26: qty 1

## 2024-01-26 MED ORDER — SODIUM CHLORIDE 0.9 % IV SOLN: INTRAVENOUS | Status: DC

## 2024-01-26 MED ORDER — ORAL CARE MOUTH RINSE: 15.0000 mL | OROMUCOSAL | Status: DC | PRN

## 2024-01-26 MED ORDER — NOREPINEPHRINE 4 MG/250ML-% IV SOLN: 0.0000 ug/min | INTRAVENOUS | Status: DC

## 2024-01-26 MED ORDER — ASPIRIN 81 MG PO TBEC
81.0000 mg | DELAYED_RELEASE_TABLET | Freq: Every day | ORAL | Status: DC
  Administered 2024-01-26 – 2024-01-27 (×2): 81 mg via ORAL
  Filled 2024-01-26 (×2): qty 1

## 2024-01-26 MED ORDER — ONDANSETRON HCL 4 MG/2ML IJ SOLN: 4.0000 mg | Freq: Four times a day (QID) | INTRAMUSCULAR | Status: DC | PRN

## 2024-01-26 MED ORDER — FENTANYL CITRATE (PF) 100 MCG/2ML IJ SOLN
INTRAMUSCULAR | Status: DC | PRN
  Administered 2024-01-26: 25 ug via INTRAVENOUS

## 2024-01-26 MED ORDER — LACTATED RINGERS IV SOLN: INTRAVENOUS | Status: DC | PRN

## 2024-01-26 MED ORDER — TRAMADOL HCL 50 MG PO TABS: 50.0000 mg | ORAL_TABLET | ORAL | Status: DC | PRN

## 2024-01-26 MED ORDER — OXYCODONE HCL 5 MG PO TABS: 5.0000 mg | ORAL_TABLET | ORAL | Status: DC | PRN

## 2024-01-26 MED ORDER — PROPOFOL 10 MG/ML IV BOLUS
INTRAVENOUS | Status: DC | PRN
  Administered 2024-01-26: 20 mg via INTRAVENOUS

## 2024-01-26 MED ORDER — CHLORHEXIDINE GLUCONATE 4 % EX SOLN: 30.0000 mL | CUTANEOUS | Status: DC

## 2024-01-26 MED ORDER — IODIXANOL 320 MG/ML IV SOLN
INTRAVENOUS | Status: DC | PRN
  Administered 2024-01-26: 30 mL

## 2024-01-26 MED ORDER — SODIUM CHLORIDE 0.9% FLUSH: 3.0000 mL | INTRAVENOUS | Status: DC | PRN

## 2024-01-26 MED ORDER — SODIUM CHLORIDE 0.9 % IV SOLN: INTRAVENOUS | Status: AC

## 2024-01-26 SURGICAL SUPPLY — 26 items
BAG SNAP BAND KOVER 36X36 (MISCELLANEOUS) ×4 IMPLANT
CABLE ADAPT PACING TEMP 12FT (ADAPTER) IMPLANT
CATH 26 ULTRA DELIVERY (CATHETERS) IMPLANT
CATH DIAG 6FR PIGTAIL ANGLED (CATHETERS) IMPLANT
CATH INFINITI 5FR ANG PIGTAIL (CATHETERS) IMPLANT
CATH INFINITI 6F AL2 (CATHETERS) IMPLANT
CATH S G BIP PACING (CATHETERS) IMPLANT
CLOSURE MYNX CONTROL 6F/7F (Vascular Products) IMPLANT
CLOSURE PERCLOSE PROSTYLE (Vascular Products) IMPLANT
CRIMPER (MISCELLANEOUS) IMPLANT
DEVICE INFLATION ATRION QL2530 (MISCELLANEOUS) IMPLANT
KIT MICROPUNCTURE NIT STIFF (SHEATH) IMPLANT
KIT SAPIAN 3 ULTRA RESILIA 26 (Valve) IMPLANT
PACK CARDIAC CATHETERIZATION (CUSTOM PROCEDURE TRAY) ×2 IMPLANT
SET ATX-X65L (MISCELLANEOUS) IMPLANT
SHEATH BRITE TIP 7FR 35CM (SHEATH) IMPLANT
SHEATH INTRODUCER SET 20-26 (SHEATH) IMPLANT
SHEATH PINNACLE 6F 10CM (SHEATH) IMPLANT
SHEATH PINNACLE 8F 10CM (SHEATH) IMPLANT
SHIELD CATH-GARD CONTAMINATION (MISCELLANEOUS) IMPLANT
STOPCOCK MORSE 400PSI 3WAY (MISCELLANEOUS) ×4 IMPLANT
WIRE AMPLATZ SS-J .035X180CM (WIRE) IMPLANT
WIRE EMERALD 3MM-J .035X150CM (WIRE) IMPLANT
WIRE EMERALD 3MM-J .035X260CM (WIRE) IMPLANT
WIRE EMERALD ST .035X260CM (WIRE) IMPLANT
WIRE SAFARI SM CURVE 275 (WIRE) IMPLANT

## 2024-01-26 NOTE — Anesthesia Preprocedure Evaluation (Signed)
 Anesthesia Evaluation  Patient identified by MRN, date of birth, ID band Patient awake    Reviewed: Allergy & Precautions, H&P , NPO status , Patient's Chart, lab work & pertinent test results  History of Anesthesia Complications Negative for: history of anesthetic complications  Airway Mallampati: II  TM Distance: >3 FB Neck ROM: Full    Dental no notable dental hx.    Pulmonary sleep apnea    Pulmonary exam normal breath sounds clear to auscultation       Cardiovascular Normal cardiovascular exam+ dysrhythmias Supra Ventricular Tachycardia  Rhythm:Regular Rate:Normal  Severe AS  IMPRESSIONS     1. Left ventricular ejection fraction, by estimation, is 70 to 75%. Left  ventricular ejection fraction by 3D volume is 72 %. The left ventricle has  hyperdynamic function. The left ventricle has no regional wall motion  abnormalities. There is mild left  ventricular hypertrophy. Left ventricular diastolic parameters were  normal. The average left ventricular global longitudinal strain is -22.5  %. The global longitudinal strain is normal.   2. Right ventricular systolic function is hyperdynamic. The right  ventricular size is normal.   3. Calcified chords, MR is only pronounced the the parasternal series.  The mitral valve is degenerative. Moderate mitral valve regurgitation.   4. Highest gradient seen in the right upper sternal border. The aortic  valve is calcified. Aortic valve regurgitation is not visualized. Severe  aortic valve stenosis. Aortic valve mean gradient measures 40.0 mmHg.  Aortic valve Vmax measures 4.46 m/s.  Aortic valve acceleration time measures 98 msec.     Neuro/Psych negative neurological ROS  negative psych ROS   GI/Hepatic negative GI ROS, Neg liver ROS,,,  Endo/Other  negative endocrine ROS    Renal/GU negative Renal ROS  negative genitourinary   Musculoskeletal negative musculoskeletal  ROS (+)    Abdominal   Peds negative pediatric ROS (+)  Hematology negative hematology ROS (+)   Anesthesia Other Findings   Reproductive/Obstetrics negative OB ROS                              Anesthesia Physical Anesthesia Plan  ASA: 4  Anesthesia Plan: MAC   Post-op Pain Management:    Induction: Intravenous  PONV Risk Score and Plan: 1 and Propofol  infusion and Treatment may vary due to age or medical condition  Airway Management Planned: Natural Airway  Additional Equipment:   Intra-op Plan:   Post-operative Plan:   Informed Consent: I have reviewed the patients History and Physical, chart, labs and discussed the procedure including the risks, benefits and alternatives for the proposed anesthesia with the patient or authorized representative who has indicated his/her understanding and acceptance.     Dental advisory given  Plan Discussed with: CRNA  Anesthesia Plan Comments:          Anesthesia Quick Evaluation

## 2024-01-26 NOTE — Discharge Summary (Signed)
 HEART AND VASCULAR CENTER   MULTIDISCIPLINARY HEART VALVE TEAM  Discharge Summary    Patient ID: [name] MRN: [id number]; DOB: 07-Nov-1950  Admit date: 01/26/2024 Discharge date: 01/27/2024  PCP:  Verdia Lombard, MD  [facility] Cardiologist:  [name], MD  [facility] Structural heart: Lonni Cash, MD [facility] Electrophysiologist:  None   Discharge Diagnoses    Principal Problem:   S/P TAVR (transcatheter aortic valve replacement) Active Problems:   Mild cognitive impairment   Severe aortic stenosis   Elevated LDL cholesterol level   Pulmonary nodule   Allergies No Known Allergies  Diagnostic Studies/Procedures    HEART AND VASCULAR CENTER  TAVR OPERATIVE NOTE     Date of Procedure:                01/26/2024   Preoperative Diagnosis:      Severe Aortic Stenosis    Postoperative Diagnosis:    Same    Procedure:        Transcatheter Aortic Valve Replacement - Transfemoral Approach             Edwards Sapien 3 THV (size 26 mm, model # [id number], serial # [id number] )              Co-Surgeons:                        Lonni Cash, MD and Linnie Rayas , MD    Anesthesiologist:                  Erma   Echocardiographer:              O'Neal   Pre-operative Echo Findings: Severe aortic stenosis Normal left ventricular systolic function   Post-operative Echo Findings: No paravalvular leak Normal left ventricular systolic function _____________    Echo 01/27/24: completed but pending formal read at the time of discharge   History of Present Illness     [name] is a 73 y.o. male with a history of CAD, HLD, laryngeal cancer, mild cognitive impairment, Parkinson's disease and severe AS who presented to [facility] on 01/26/24 for planned TAVR.   The patient developed progressive SOB. Echo 12/08/23 showed EF 70%, mod MR with calcified chords, severe AS with mean grad 40 mmHg, Vmax 4.46 m/s, AVA 1.17 cm2,  DVI 0.34, SVI 47. [facility] 12/15/23 showed moderate distal left main disease, moderate to severe LAD/RPDA disease. Normal filling pressures. Medical therapy was recommended.   The patient was evaluated by the multidisciplinary valve team and felt to have severe, symptomatic aortic stenosis and to be a suitable candidate for TAVR, which was set up for 01/26/24.  Hospital Course     Consultants: none   Severe AS:  -- S/p successful TAVR with a 26 mm Edwards Sapien 3 Ultra Resilia THV via the TF approach on 01/26/24.  -- Post operative echo completed but pending formal read. -- Groin sites are stable.  -- ECG with sinus/PVCs and no high grade heart block. -- Continue Asprin 81mg  daily.  -- Met with cardiac rehab to discuss CRP phase II.  -- Plan for discharge home today with close follow up in the outpatient setting.   Pulmonary nodule: -- Pre TAVR CTs noted a 9 mm right upper lobe noncalcified pulmonary nodule is present. Consider one of the following in 3 months for both low-risk and high-risk individuals: (a) repeat chest CT, (b) follow-up PET-CT, or (c) tissue sampling. --  This will be dicussed in the outpatient setting. Per Dr. Shyrl, will need to send to Dr. Shelah _____________  Discharge Vitals Blood pressure 112/65, pulse 89, temperature 98.2 F (36.8 C), temperature source Oral, resp. rate 16, height 6' (1.829 m), weight 80 kg, SpO2 98%.  Filed Weights   01/25/24 1300 01/26/24 1117 01/27/24 0500  Weight: 78.3 kg 77.1 kg 80 kg     GEN: Well nourished, well developed in no acute distress NECK: No JVD CARDIAC: RRR, no murmurs, rubs, gallops RESPIRATORY:  Clear to auscultation without rales, wheezing or rhonchi  ABDOMEN: Soft, non-tender, non-distended EXTREMITIES:  No edema; No deformity.  Groin sites clear without hematoma or ecchymosis.    Disposition   Pt is being discharged home today in good condition.  Follow-up Plans & Appointments     Follow-up Information      Sebastian Lamarr SAUNDERS, PA-C. Go on 02/04/2024.   Specialties: Cardiology, Radiology Why: @ 1:05 pm, please arrive at least 20 minutes early. Contact information: [address] [place] [id number] [phone]                  Discharge Medications   Allergies as of 01/27/2024   No Known Allergies      Medication List     STOP taking these medications    metoprolol  tartrate 100 MG tablet Commonly known as: Lopressor        TAKE these medications    aspirin  EC 81 MG tablet Take 1 tablet (81 mg total) by mouth daily. Swallow whole.   FISH OIL PO Take 2 each by mouth daily. Gummies   memantine  10 MG tablet Commonly known as: NAMENDA  Take 10 mg by mouth 2 (two) times daily.   multivitamin with minerals tablet Take 1 tablet by mouth daily. Advanced Multivitamin 50+   ADULT GUMMY PO Take 3 each by mouth daily. Advanced Multivitamin 50+   rosuvastatin  20 MG tablet Commonly known as: Crestor  Take 1 tablet (20 mg total) by mouth daily.   VITAMIN B-12 PO Take 2 each by mouth daily. Gummies   Vitamin D-3 125 MCG (5000 UT) Tabs Take 5,000 Units by mouth daily.          Outstanding Labs/Studies   none  ______________________  Duration of Discharge Encounter: APP Time: 15 minutes    Signed, Lamarr Sebastian, PA-C 01/27/2024, 9:31 AM [phone]

## 2024-01-26 NOTE — Op Note (Signed)
 Signed       HEART AND VASCULAR CENTER   MULTIDISCIPLINARY HEART VALVE TEAM     TAVR OPERATIVE NOTE     Date of Procedure:                01/26/2024   Preoperative Diagnosis:      Severe Aortic Stenosis    Postoperative Diagnosis:    Same    Procedure:        Transcatheter Aortic Valve Replacement - Percutaneous *** Transfemoral Approach             ***              Co-Surgeons:                        Linnie Rayas, MD and ***, MD   Anesthesiologist:                  General   Echocardiographer:              ***, MD   Pre-operative Echo Findings: Severe aortic stenosis normal left ventricular systolic function ***   Post-operative Echo Findings: *** paravalvular leak normal left ventricular systolic function ***     BRIEF CLINICAL NOTE AND INDICATIONS FOR SURGERY   ***       DETAILS OF THE OPERATIVE PROCEDURE   PREPARATION:     The patient was brought to the operating room on the above mentioned date and appropriate monitoring was established by the anesthesia team. The patient was placed in the supine position on the operating table.  Intravenous antibiotics were administered. The patient was monitored closely throughout the procedure under conscious sedation.   Baseline transthoracic echocardiogram was performed. The patient's abdomen and both groins were prepped and draped in a sterile manner. A time out procedure was performed.     PERIPHERAL ACCESS:     Using the modified Seldinger technique, femoral arterial was obtained with placement of 6 Fr sheath on the *** side.  A pigtail diagnostic catheter was passed through the arterial sheath under fluoroscopic guidance into the aortic root. Venous access was from the ***vein.  A temporary transvenous pacemaker catheter was passed through the venous sheath under fluoroscopic guidance into the right ventricle.  The pacemaker was tested to ensure stable lead placement and pacemaker capture. Aortic root  angiography was performed in order to determine the optimal angiographic angle for valve deployment.     TRANSFEMORAL ACCESS:    Percutaneous transfemoral access and sheath placement was performed using ultrasound guidance.  The *** common femoral artery was cannulated using a micropuncture needle and appropriate location was verified using hand injection angiogram.  A pair of Abbott Perclose percutaneous closure devices were placed and a 6 French sheath replaced into the femoral artery.  The patient was heparinized systemically and ACT verified > 250 seconds.     A 14 Fr transfemoral E-sheath was introduced into the *** common femoral artery after progressively dilating over an Amplatz superstiff wire. An pigtail catheter was used to direct a straight-tip exchange length wire across the native aortic valve into the left ventricle. This was exchanged out for a pigtail catheter and position was confirmed in the LV apex. Simultaneous LV and Ao pressures were recorded.  The pigtail catheter was exchanged for a Safari wire in the LV apex.         TRANSCATHETER HEART VALVE DEPLOYMENT:    An ***  transcatheter heart valve (  size *** mm) was prepared and crimped per manufacturer's guidelines, and the proper orientation of the valve is confirmed on the [employer] delivery system. The valve was advanced through the introducer sheath using normal technique until in an appropriate position in the abdominal aorta beyond the sheath tip. The balloon was then retracted and using the fine-tuning wheel was centered on the valve. The valve was then advanced across the aortic arch using appropriate flexion of the catheter. The valve was carefully positioned across the aortic valve annulus. The Commander catheter was retracted using normal technique. Once final position of the valve has been confirmed by angiographic assessment, the valve is deployed during rapid ventricular pacing to maintain systolic blood  pressure < 50 mmHg and pulse pressure < 10 mmHg. The balloon inflation is held for >3 seconds after reaching full deployment volume. Once the balloon has fully deflated the balloon is retracted into the ascending aorta and valve function is assessed using echocardiography. ***There is felt to be trace paravalvular leak and no central aortic insufficiency.  The patient's hemodynamic recovery following valve deployment is good.  The deployment balloon and guidewire are both removed.      PROCEDURE COMPLETION:    The sheath was removed and femoral artery closure performed.  Protamine  was administered once femoral arterial repair was complete. The temporary pacemaker***, pigtail catheter and femoral sheaths were removed with manual pressure used for venous hemostasis.  ***A Mynx femoral closure device was utilized following removal of the diagnostic sheath in the *** femoral artery.   The patient tolerated the procedure well and is transported to the cath lab recovery area in stable condition. There were no immediate intraoperative complications. All sponge instrument and needle counts are verified correct at completion of the operation.    No blood products were administered during the operation.   The patient received a total of *** mL of intravenous contrast during the procedure.

## 2024-01-26 NOTE — Progress Notes (Signed)
  HEART AND VASCULAR CENTER   MULTIDISCIPLINARY HEART VALVE TEAM  Patient doing well s/p TAVR. He is hemodynamically stable. Groin sites stable. ECG with sinus and no high grade block. Transferred from cath lab holding to 4E. Early ambulation after bedrest completed and hopeful discharge over the next 24-48 hours.   Lamarr Hummer PA-C  [facility]  Pager [phone]

## 2024-01-26 NOTE — Interval H&P Note (Signed)
 History and Physical Interval Note:  01/26/2024 11:10 AM  [name]  has presented today for surgery, with the diagnosis of Severe Aortic Stenosis.  The various methods of treatment have been discussed with the patient and family. After consideration of risks, benefits and other options for treatment, the patient has consented to  Procedure(s): Transcatheter Aortic Valve Replacement, Transfemoral (Right) ECHOCARDIOGRAM, TRANSTHORACIC (N/A) as a surgical intervention.  The patient's history has been reviewed, patient examined, no change in status, stable for surgery.  I have reviewed the patient's chart and labs.  Questions were answered to the patient's satisfaction.     Mandie Crabbe [name]

## 2024-01-26 NOTE — Progress Notes (Signed)
 Site area: 38F left femoral venous sheath Site Prior to Removal:  Level 0 Pressure Applied For: 15 minutes Manual:   yes Patient Status During Pull:  stable Post Pull Site:  Level 0 Post Pull Instructions Given:  yes Post Pull Pulses Present: left dp palpable Dressing Applied:  gauze and tegaderm Bedrest begins @ 1510 Comments:

## 2024-01-26 NOTE — Discharge Instructions (Signed)

## 2024-01-26 NOTE — CV Procedure (Signed)
 HEART AND VASCULAR CENTER  TAVR OPERATIVE NOTE   Date of Procedure:  01/26/2024  Preoperative Diagnosis: Severe Aortic Stenosis   Postoperative Diagnosis: Same   Procedure:   Transcatheter Aortic Valve Replacement - Transfemoral Approach  Edwards Sapien 3 THV (size 26 mm, model # [id number], serial # [id number] )   Co-Surgeons:  Lonni Cash, MD and Linnie Rayas , MD   Anesthesiologist:  Erma  Echocardiographer:  O'Neal  Pre-operative Echo Findings: Severe aortic stenosis Normal left ventricular systolic function  Post-operative Echo Findings: No paravalvular leak Normal left ventricular systolic function  BRIEF CLINICAL NOTE AND INDICATIONS FOR SURGERY  73 yo male with history of CAD, HLD, memory disorder and severe aortic stenosis. Echo with LVEF=70%. Severe AS with mean gradient 40 mmHg. Cardiac cath with moderate non-obstructive disease.   During the course of the patient's preoperative work up they have been evaluated comprehensively by a multidisciplinary team of specialists coordinated through the Multidisciplinary Heart Valve Clinic in the [facility].  They have been demonstrated to suffer from symptomatic severe aortic stenosis as noted above. The patient has been counseled extensively as to the relative risks and benefits of all options for the treatment of severe aortic stenosis including long term medical therapy, conventional surgery for aortic valve replacement, and transcatheter aortic valve replacement.  The patient has been independently evaluated by Dr. Rayas with CT surgery and they are felt to be at high risk for conventional surgical aortic valve replacement. The surgeon indicated the patient would be a poor candidate for conventional surgery. Based upon review of all of the patient's preoperative diagnostic tests they are felt to be candidate for transcatheter aortic valve replacement using the transfemoral approach as  an alternative to high risk conventional surgery.    Following the decision to proceed with transcatheter aortic valve replacement, a discussion has been held regarding what types of management strategies would be attempted intraoperatively in the event of life-threatening complications, including whether or not the patient would be considered a candidate for the use of cardiopulmonary bypass and/or conversion to open sternotomy for attempted surgical intervention.  The patient has been advised of a variety of complications that might develop peculiar to this approach including but not limited to risks of death, stroke, paravalvular leak, aortic dissection or other major vascular complications, aortic annulus rupture, device embolization, cardiac rupture or perforation, acute myocardial infarction, arrhythmia, heart block or bradycardia requiring permanent pacemaker placement, congestive heart failure, respiratory failure, renal failure, pneumonia, infection, other late complications related to structural valve deterioration or migration, or other complications that might ultimately cause a temporary or permanent loss of functional independence or other long term morbidity.  The patient provides full informed consent for the procedure as described and all questions were answered preoperatively.   DETAILS OF THE OPERATIVE PROCEDURE  PREPARATION:   The patient is brought to the operating room on the above mentioned date and central monitoring was established by the anesthesia team including placement of a radial arterial line. The patient is placed in the supine position on the operating table.  Intravenous antibiotics are administered. Conscious sedation is used.   Baseline transthoracic echocardiogram was performed. The patient's chest, abdomen, both groins, and both lower extremities are prepared and draped in a sterile manner. A time out procedure is performed.   PERIPHERAL ACCESS:   Using the  modified Seldinger technique, femoral arterial and venous access were obtained with placement of a 6 Fr sheath in the artery and  a 7 Fr sheath in the vein on the left side using u/s guidance.  A pigtail diagnostic catheter was passed through the femoral arterial sheath under fluoroscopic guidance into the aortic root.  A temporary transvenous pacemaker catheter was passed through the femoral venous sheath under fluoroscopic guidance into the right ventricle.  The pacemaker was tested to ensure stable lead placement and pacemaker capture. Aortic root angiography was performed in order to determine the optimal angiographic angle for valve deployment.  TRANSFEMORAL ACCESS:  A micropuncture kit was used to gain access to the right femoral artery using u/s guidance. Position confirmed with angiography. Pre-closure with double ProGlide closure devices. The patient was heparinized systemically and ACT verified > 250 seconds.    A 14 Fr transfemoral E-sheath was introduced into the right femoral artery after progressively dilating over an Amplatz superstiff wire. An AL-2 catheter was used to direct a straight-tip exchange length wire across the native aortic valve into the left ventricle. This was exchanged out for a pigtail catheter and position was confirmed in the LV apex. Simultaneous LV and Ao pressures were recorded.  The pigtail catheter was then exchanged for a Safari wire in the LV apex.   TRANSCATHETER HEART VALVE DEPLOYMENT:  An Edwards Sapien 3 THV (size 26 mm) was prepared and crimped per manufacturer's guidelines, and the proper orientation of the valve is confirmed on the [employer] delivery system. The valve was advanced through the introducer sheath using normal technique until in an appropriate position in the abdominal aorta beyond the sheath tip. The balloon was then retracted and using the fine-tuning wheel was centered on the valve. The valve was then advanced across the aortic arch  using appropriate flexion of the catheter. The valve was carefully positioned across the aortic valve annulus. The Commander catheter was retracted using normal technique. Once final position of the valve has been confirmed by angiographic assessment, the valve is deployed while temporarily holding ventilation and during rapid ventricular pacing to maintain systolic blood pressure < 50 mmHg and pulse pressure < 10 mmHg. The balloon inflation is held for >3 seconds after reaching full deployment volume. Once the balloon has fully deflated the balloon is retracted into the ascending aorta and valve function is assessed using TTE. There is felt to be no paravalvular leak and no central aortic insufficiency.  The patient's hemodynamic recovery following valve deployment is good.  The deployment balloon and guidewire are both removed. Echo demostrated acceptable post-procedural gradients, stable mitral valve function, and no AI.   PROCEDURE COMPLETION:  The sheath was then removed and closure devices were completed. Protamine  was administered once femoral arterial repair was complete. The temporary pacemaker, pigtail catheters and femoral sheaths were removed with a Mynx closure device placed in the artery and manual pressure used for venous hemostasis.    The patient tolerated the procedure well and is transported to the surgical intensive care in stable condition. There were no immediate intraoperative complications. All sponge instrument and needle counts are verified correct at completion of the operation.   No blood products were administered during the operation.  The patient received a total of 30 mL of intravenous contrast during the procedure.  LVEDP: 22 mmHg  Lonni Cash MD, FACC 01/26/2024 2:34 PM

## 2024-01-26 NOTE — Anesthesia Postprocedure Evaluation (Signed)
 Anesthesia Post Note  Patient: [name] Furnish  Procedure(s) Performed: Transcatheter Aortic Valve Replacement, Transfemoral (Right) ECHOCARDIOGRAM, TRANSTHORACIC     Patient location during evaluation: PACU Anesthesia Type: MAC Level of consciousness: awake and alert Pain management: pain level controlled Vital Signs Assessment: post-procedure vital signs reviewed and stable Respiratory status: spontaneous breathing, nonlabored ventilation, respiratory function stable and patient connected to nasal cannula oxygen Cardiovascular status: stable and blood pressure returned to baseline Postop Assessment: no apparent nausea or vomiting Anesthetic complications: no   There were no known notable events for this encounter.  Last Vitals:  Vitals:   01/26/24 1515 01/26/24 1525  BP: 122/73   Pulse: 62 68  Resp: (!) 7 10  Temp: 36.4 C   SpO2: 99%     Last Pain:  Vitals:   01/26/24 1515  TempSrc: Oral  PainSc: 0-No pain                 [name]

## 2024-01-26 NOTE — Transfer of Care (Signed)
 Immediate Anesthesia Transfer of Care Note  Patient: [name]  Procedure(s) Performed: Transcatheter Aortic Valve Replacement, Transfemoral (Right) ECHOCARDIOGRAM, TRANSTHORACIC  Patient Location: PACU and Cath Lab  Anesthesia Type:MAC  Level of Consciousness: awake and drowsy  Airway & Oxygen Therapy: Patient Spontanous Breathing and Patient connected to nasal cannula oxygen  Post-op Assessment: Report given to RN and Post -op Vital signs reviewed and stable  Post vital signs: Reviewed and stable  Last Vitals:  Vitals Value Taken Time  BP 134/83 01/26/24 14:25  Temp    Pulse 60 01/26/24 14:29  Resp 18 01/26/24 14:29  SpO2 94 % 01/26/24 14:29  Vitals shown include unfiled device data.  Last Pain:  Vitals:   01/26/24 1138  TempSrc:   PainSc: 0-No pain      Patients Stated Pain Goal: 2 (01/26/24 1138)  Complications: There were no known notable events for this encounter.

## 2024-01-27 ENCOUNTER — Inpatient Hospital Stay (HOSPITAL_COMMUNITY)

## 2024-01-27 DIAGNOSIS — Z952 Presence of prosthetic heart valve: Secondary | ICD-10-CM

## 2024-01-27 DIAGNOSIS — R911 Solitary pulmonary nodule: Secondary | ICD-10-CM | POA: Insufficient documentation

## 2024-01-27 LAB — ECHOCARDIOGRAM COMPLETE
AR max vel: 2.32 cm2
AV Area VTI: 2.56 cm2
AV Area mean vel: 2.35 cm2
AV Mean grad: 8.7 mmHg
AV Peak grad: 17.1 mmHg
Ao pk vel: 2.07 m/s
Area-P 1/2: 3.6 cm2
Height: 72 in
MV VTI: 3.96 cm2
S' Lateral: 2.7 cm
Weight: 2821.89 [oz_av]

## 2024-01-27 LAB — CBC
HCT: 37.8 % — ABNORMAL LOW (ref 39.0–52.0)
Hemoglobin: 13.4 g/dL (ref 13.0–17.0)
MCH: 33 pg (ref 26.0–34.0)
MCHC: 35.4 g/dL (ref 30.0–36.0)
MCV: 93.1 fL (ref 80.0–100.0)
Platelets: 169 K/uL (ref 150–400)
RBC: 4.06 MIL/uL — ABNORMAL LOW (ref 4.22–5.81)
RDW: 12.5 % (ref 11.5–15.5)
WBC: 9 K/uL (ref 4.0–10.5)
nRBC: 0 % (ref 0.0–0.2)

## 2024-01-27 LAB — BASIC METABOLIC PANEL WITH GFR
Anion gap: 10 (ref 5–15)
BUN: 10 mg/dL (ref 8–23)
CO2: 24 mmol/L (ref 22–32)
Calcium: 8.8 mg/dL — ABNORMAL LOW (ref 8.9–10.3)
Chloride: 103 mmol/L (ref 98–111)
Creatinine, Ser: 0.79 mg/dL (ref 0.61–1.24)
GFR, Estimated: >60 mL/min (ref >60)
Glucose, Bld: 106 mg/dL — ABNORMAL HIGH (ref 70–99)
Potassium: 3.9 mmol/L (ref 3.5–5.1)
Sodium: 137 mmol/L (ref 135–145)

## 2024-01-27 LAB — MAGNESIUM: Magnesium: 1.9 mg/dL (ref 1.7–2.4)

## 2024-01-27 NOTE — Progress Notes (Signed)
 Mobility Specialist Progress Note;   01/27/24 1007  Mobility  Activity Ambulated with assistance  Level of Assistance Standby assist, set-up cues, supervision of patient - no hands on  Assistive Device None  Distance Ambulated (ft) 200 ft  Activity Response Tolerated well  Mobility Referral Yes  Mobility visit 1 Mobility  Mobility Specialist Start Time (ACUTE ONLY) 1007  Mobility Specialist Stop Time (ACUTE ONLY) 1015  Mobility Specialist Time Calculation (min) (ACUTE ONLY) 8 min   Pt agreeable to mobility. Required no physical assistance during ambulation, SV for safety. VSS throughout and no c/o when asked. Pt returned back to bed and left with all needs met.   Lauraine Erm Mobility Specialist Please contact via SecureChat or [employer] [phone]

## 2024-01-27 NOTE — Progress Notes (Signed)
 Discussed with pt restrictions, exercise guidelines, and CRPII. Pt receptive. Will refer to [facility] CRPII.  [phone] Aliene Aris BS, ACSM-CEP 01/27/2024 10:20 AM

## 2024-01-27 NOTE — Progress Notes (Signed)
   01/27/24 1300  TOC Brief Assessment  Insurance and Status Reviewed  Patient has primary care physician Yes  Home environment has been reviewed home w/ spouse  Prior level of function: self  Prior/Current Home Services No current home services  Social Drivers of Health Review SDOH reviewed no interventions necessary  Readmission risk has been reviewed Yes  Transition of care needs no transition of care needs at this time    S/P TAVR, stable for transition home today, no HH or DME needs noted. Family to transport home

## 2024-01-27 NOTE — Progress Notes (Signed)
  Echocardiogram 2D Echocardiogram has been performed.  [name] 01/27/2024, 10:38 AM

## 2024-01-27 NOTE — Plan of Care (Signed)

## 2024-02-01 ENCOUNTER — Telehealth (HOSPITAL_COMMUNITY): Payer: Self-pay

## 2024-02-01 NOTE — Telephone Encounter (Signed)
 Attempted to call patient in regards to Cardiac Rehab - LM on VM

## 2024-02-04 ENCOUNTER — Ambulatory Visit: Attending: Physician Assistant | Admitting: Physician Assistant

## 2024-02-04 VITALS — BP 108/62 | HR 79 | Ht 72.0 in | Wt 170.6 lb

## 2024-02-04 DIAGNOSIS — R911 Solitary pulmonary nodule: Secondary | ICD-10-CM | POA: Diagnosis not present

## 2024-02-04 DIAGNOSIS — Z952 Presence of prosthetic heart valve: Secondary | ICD-10-CM

## 2024-02-04 DIAGNOSIS — G4733 Obstructive sleep apnea (adult) (pediatric): Secondary | ICD-10-CM

## 2024-02-04 MED ORDER — AMOXICILLIN 500 MG PO TABS: 2000.0000 mg | ORAL_TABLET | ORAL | 12 refills | Status: AC

## 2024-02-04 NOTE — Patient Instructions (Addendum)
 Medication Instructions:  Your physician has recommended you make the following change in your medication:  Start Amoxicillin 500 mg, take 4 tablets by mouth 1 hour prior to dental procedures and cleanings.    *If you need a refill on your cardiac medications before your next appointment, please call your pharmacy*  Lab Work: None needed If you have labs (blood work) drawn today and your tests are completely normal, you will receive your results only by: MyChart Message (if you have MyChart) OR A paper copy in the mail If you have any lab test that is abnormal or we need to change your treatment, we will call you to review the results.  Testing/Procedures: 03/10/24 Your physician has requested that you have an echocardiogram. Echocardiography is a painless test that uses sound waves to create images of your heart. It provides your doctor with information about the size and shape of your heart and how well your heart's chambers and valves are working. This procedure takes approximately one hour. There are no restrictions for this procedure. Please do NOT wear cologne, perfume, aftershave, or lotions (deodorant is allowed). Please arrive 15 minutes prior to your appointment time.  Please note: We ask at that you not bring children with you during ultrasound (echo/ vascular) testing. Due to room size and safety concerns, children are not allowed in the ultrasound rooms during exams. Our front office staff cannot provide observation of children in our lobby area while testing is being conducted. An adult accompanying a patient to their appointment will only be allowed in the ultrasound room at the discretion of the ultrasound technician under special circumstances. We apologize for any inconvenience.   Follow-Up: At [facility], you and your health needs are our priority.  As part of our continuing mission to provide you with exceptional heart care, our providers are all part of one team.   This team includes your primary Cardiologist (physician) and Advanced Practice Providers or APPs (Physician Assistants and Nurse Practitioners) who all work together to provide you with the care you need, when you need it.  Your next appointment:   As scheduled on 03/10/24  Provider:   Izetta Hummer, PA-C  We recommend signing up for the patient portal called MyChart.  Sign up information is provided on this After Visit Summary.  MyChart is used to connect with patients for Virtual Visits (Telemedicine).  Patients are able to view lab/test results, encounter notes, upcoming appointments, etc.  Non-urgent messages can be sent to your provider as well.   To learn more about what you can do with MyChart, go to [email or web address].   Other Instructions We have referred you to the pulmonology nodule clinic. They will reach out to you to schedule an appointment.

## 2024-02-04 NOTE — Progress Notes (Signed)
 HEART AND VASCULAR CENTER   MULTIDISCIPLINARY HEART VALVE CLINIC                                     Cardiology Office Note:    Date:  02/04/2024   ID:  [name], DOB 1950/05/05, MRN [id number]  PCP:  Verdia Lombard, MD  [facility] Cardiologist:  [name], MD  [facility] Structural heart: Lonni Cash, MD [facility] Electrophysiologist:  None   Referring MD: Verdia Lombard, MD   [facility] s/p TAVR  History of Present Illness:    [name] is a 73 y.o. 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   The patient developed progressive SOB. Echo 12/08/23 showed EF 70%, mod MR with calcified chords, severe AS with mean grad 40 mmHg, Vmax 4.46 m/s, AVA 1.17 cm2, DVI 0.34, SVI 47. [facility] 12/15/23 showed moderate distal left main disease, moderate to severe LAD/RPDA disease. Normal filling pressures. Medical therapy was recommended. S/p TAVR with a 26 mm Edwards Sapien 3 Ultra Resilia THV via the TF approach on 01/26/24. Post operative echo showed EF 65%, normally functioning TAVR with a mean gradient of 10 mmHg and no PVL.  Today the patient presents to clinic for follow up. No CP or SOB. No LE edema, orthopnea or PND. No dizziness or syncope. No blood in stool or urine. No palpitations. Has some bruising at groin sites. Overall, feeling better since TAVR. When he lays down at night he hears mice pitter-patting in chest. Not pain. Has had chronic tinnitus. Possibly pulsatile tinnitus, seeing neuro next week.     Past Medical History:  Diagnosis Date   Cancer (HCC)    larygneal 2003, ssq L eye used interferon gtts   Gout    OSA (obstructive sleep apnea)    has inspire 2023   S/P TAVR (transcatheter aortic valve replacement) 01/26/2024   s/p TAVR with a 26 mm Edwards Sapien 3 Ultra Resilia THV via the TF approach by Dr.  Cash and Dr. Shyrl.   SVT (supraventricular tachycardia)    benign heart murmur     Current Medications: Current Meds  Medication Sig   amoxicillin (AMOXIL) 500 MG tablet Take 4 tablets (2,000 mg total) by mouth as directed. 1 hour prior to dental work including cleanings   aspirin  EC 81 MG tablet Take 1 tablet (81 mg total) by mouth daily. Swallow whole.   Cholecalciferol (VITAMIN D-3) 125 MCG (5000 UT) TABS Take 5,000 Units by mouth daily.   Cyanocobalamin (VITAMIN B-12 PO) Take 2 each by mouth daily. Gummies   memantine  (NAMENDA ) 10 MG tablet Take 10 mg by mouth 2 (two) times daily.   Multiple Vitamins-Minerals (ADULT GUMMY PO) Take 3 each by mouth daily. Advanced Multivitamin 50+   Multiple Vitamins-Minerals (MULTIVITAMIN WITH MINERALS) tablet Take 1 tablet by mouth daily. Advanced Multivitamin 50+   Omega-3 Fatty Acids (FISH OIL PO) Take 2 each by mouth daily. Gummies   rosuvastatin  (CRESTOR ) 20 MG tablet Take 1 tablet (20 mg total) by mouth daily.      ROS:   Please see the history of present illness.    All other systems reviewed and are negative.  EKGs   EKG Interpretation Date/Time:  Thursday February 04 2024 13:26:28 EST Ventricular Rate:  69  PR Interval:  166 QRS Duration:  68 QT Interval:  336 QTC Calculation: 383 R Axis:   49  Text Interpretation: Normal sinus rhythm Anteroseptal infarct , age undetermined Confirmed by Sebastian Collar [phone]) on 02/04/2024 6:37:42 PM   Risk Assessment/Calculations:           Physical Exam:    VS:  BP 108/62   Pulse 79   Ht 6' (1.829 m)   Wt 170 lb 9.6 oz (77.4 kg)   SpO2 98%   BMI 23.14 kg/m     Wt Readings from Last 3 Encounters:  02/04/24 170 lb 9.6 oz (77.4 kg)  01/27/24 176 lb 5.9 oz (80 kg)  01/08/24 172 lb 9.6 oz (78.3 kg)     GEN: Well nourished, well developed in no acute distress NECK: No JVD CARDIAC: RRR, no murmurs, rubs, gallops RESPIRATORY:  Clear to auscultation without rales,  wheezing or rhonchi  ABDOMEN: Soft, non-tender, non-distended EXTREMITIES:  No edema; No deformity.  Groin sites clear without hematoma. Ecchymosis bilaterally and on pubic bone.   ASSESSMENT:    1. S/P TAVR (transcatheter aortic valve replacement)   2. OSA (obstructive sleep apnea)   3. Pulmonary nodule     PLAN:    In order of problems listed above:  Severe AS s/p TAVR:  -- Pt doing well s/p TAVR.  -- ECG with no HAVB.  -- Groin sites healing well although diffuse ecchymosis noted.  -- SBE prophylaxis discussed; I have RX'd amoxicillin. .  -- Continue Aspirin  81mg  daily. -- Cleared to resume all activities without restriction. -- I will see back for 1 month echo and OV.  Pulsatile tinnitus: -- Has been ongoing. Seeing neuro next week and will discuss issue.   OSA: -- Has Inspire sleep apnea implant.  -- I wonder if there is noise from device that he is hearing at night. He will try to pay attention to subjective sound of mice in his chest when device is on and off.   Pulmonary nodule: -- Pre TAVR CTs noted a 9 mm right upper lobe noncalcified pulmonary nodule is present. Consider one of the following in 3 months for both low-risk and high-risk individuals: (a) repeat chest CT, (b) follow-up PET-CT, or (c) tissue sampling. -- This was discussed today. Per Dr. Shyrl, will need to send to Dr. Shelah. Referral sent.     Cardiac Rehabilitation Eligibility Assessment  The patient is ready to start cardiac rehabilitation from a cardiac standpoint.          Medication Adjustments/Labs and Tests Ordered: Current medicines are reviewed at length with the patient today.  Concerns regarding medicines are outlined above.  Orders Placed This Encounter  Procedures   AMB  Referral to Pulmonary Nodule Clinic   EKG 12-Lead   ECHOCARDIOGRAM COMPLETE   Meds ordered this encounter  Medications   amoxicillin (AMOXIL) 500 MG tablet    Sig: Take 4 tablets (2,000 mg total) by  mouth as directed. 1 hour prior to dental work including cleanings    Dispense:  12 tablet    Refill:  12    Supervising Provider:   WONDA SHARPER [[id number]]    Patient Instructions  Medication Instructions:  Your physician has recommended you make the following change in your medication:  Start Amoxicillin 500 mg, take 4 tablets by mouth 1 hour prior to dental procedures and cleanings.    *If you need a refill on your cardiac medications before your next appointment, please call your  pharmacy*  Lab Work: None needed If you have labs (blood work) drawn today and your tests are completely normal, you will receive your results only by: MyChart Message (if you have MyChart) OR A paper copy in the mail If you have any lab test that is abnormal or we need to change your treatment, we will call you to review the results.  Testing/Procedures: 03/10/24 Your physician has requested that you have an echocardiogram. Echocardiography is a painless test that uses sound waves to create images of your heart. It provides your doctor with information about the size and shape of your heart and how well your heart's chambers and valves are working. This procedure takes approximately one hour. There are no restrictions for this procedure. Please do NOT wear cologne, perfume, aftershave, or lotions (deodorant is allowed). Please arrive 15 minutes prior to your appointment time.  Please note: We ask at that you not bring children with you during ultrasound (echo/ vascular) testing. Due to room size and safety concerns, children are not allowed in the ultrasound rooms during exams. Our front office staff cannot provide observation of children in our lobby area while testing is being conducted. An adult accompanying a patient to their appointment will only be allowed in the ultrasound room at the discretion of the ultrasound technician under special circumstances. We apologize for any inconvenience.   Follow-Up: At  [facility], you and your health needs are our priority.  As part of our continuing mission to provide you with exceptional heart care, our providers are all part of one team.  This team includes your primary Cardiologist (physician) and Advanced Practice Providers or APPs (Physician Assistants and Nurse Practitioners) who all work together to provide you with the care you need, when you need it.  Your next appointment:   As scheduled on 03/10/24  Provider:   Izetta Hummer, PA-C  We recommend signing up for the patient portal called MyChart.  Sign up information is provided on this After Visit Summary.  MyChart is used to connect with patients for Virtual Visits (Telemedicine).  Patients are able to view lab/test results, encounter notes, upcoming appointments, etc.  Non-urgent messages can be sent to your provider as well.   To learn more about what you can do with MyChart, go to [email or web address].   Other Instructions We have referred you to the pulmonology nodule clinic. They will reach out to you to schedule an appointment.         Signed, Lamarr Hummer, PA-C  02/04/2024 6:45 PM    [facility]

## 2024-02-08 ENCOUNTER — Ambulatory Visit: Admitting: Psychiatry

## 2024-02-08 DIAGNOSIS — G3184 Mild cognitive impairment, so stated: Secondary | ICD-10-CM

## 2024-02-08 DIAGNOSIS — F4323 Adjustment disorder with mixed anxiety and depressed mood: Secondary | ICD-10-CM | POA: Diagnosis not present

## 2024-02-08 DIAGNOSIS — Z952 Presence of prosthetic heart valve: Secondary | ICD-10-CM

## 2024-02-08 DIAGNOSIS — R69 Illness, unspecified: Secondary | ICD-10-CM | POA: Diagnosis not present

## 2024-02-08 DIAGNOSIS — R911 Solitary pulmonary nodule: Secondary | ICD-10-CM | POA: Diagnosis not present
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
## 2024-02-09 ENCOUNTER — Ambulatory Visit: Admitting: Diagnostic Neuroimaging

## 2024-02-09 ENCOUNTER — Encounter: Payer: Self-pay | Admitting: Diagnostic Neuroimaging

## 2024-02-09 VITALS — BP 116/70 | HR 78 | Ht 72.0 in | Wt 170.8 lb

## 2024-02-09 DIAGNOSIS — G20A1 Parkinson's disease without dyskinesia, without mention of fluctuations: Secondary | ICD-10-CM

## 2024-02-09 MED ORDER — CARBIDOPA-LEVODOPA 25-100 MG PO TABS: 1.0000 | ORAL_TABLET | Freq: Three times a day (TID) | ORAL | 12 refills | Status: AC

## 2024-02-09 NOTE — Progress Notes (Signed)
 [facility]  PATIENT: [name] DOB: 1950-04-02  REFERRING CLINICIAN: Verdia Lombard, MD HISTORY FROM: Patient and wife REASON FOR VISIT: New consult   HISTORICAL  CHIEF COMPLAINT:  Chief Complaint  Patient presents with   RM 7     Patient is here with wife for memory - had surgergy done on aortic valve on 01/26/24 MMSE - 23     HISTORY OF PRESENT ILLNESS:   73 year old right-handed male here for evaluation of physical and cognitive changes.  Around 2023 had onset of slow responses in his speech, facial expressions, arms and legs.  Noted some difficulty with handwriting.  Also had some changes in his swallowing and voice that may have started around 2020.  Over the past 1 year has had some issues with some mild cognitive changes.  Had diagnosis of sleep apnea treated with inspire device.  Remote history of gradual decrease sense of smell.  Had some issues with acting out dreams and family history of Parkinson's disease in the paternal uncle.  Punching and kicking in sleep.  Saw neurologist in July 2025, with additional testing including DAT scan, ATN panel, MRI.  Possibility of Parkinson's disease versus Alzheimer's disease was raised.  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   REVIEW OF SYSTEMS: Full 14 system review of systems performed and negative with exception of: as per HPI.  ALLERGIES: No Known Allergies  HOME MEDICATIONS: Outpatient Medications Prior to Visit  Medication Sig Dispense Refill   amoxicillin  (AMOXIL ) 500 MG tablet Take 4 tablets (2,000 mg total) by mouth as directed. 1 hour prior to dental work including cleanings 12 tablet 12   aspirin  EC 81 MG tablet Take 1 tablet (81 mg total) by mouth daily. Swallow whole. 90 tablet 3   Cholecalciferol (VITAMIN D-3) 125 MCG (5000 UT) TABS Take 5,000 Units by mouth daily.     Cyanocobalamin (VITAMIN B-12 PO)  Take 2 each by mouth daily. Gummies     memantine  (NAMENDA ) 10 MG tablet Take 10 mg by mouth 2 (two) times daily.     Multiple Vitamins-Minerals (ADULT GUMMY PO) Take 3 each by mouth daily. Advanced Multivitamin 50+     Multiple Vitamins-Minerals (MULTIVITAMIN WITH MINERALS) tablet Take 1 tablet by mouth daily. Advanced Multivitamin 50+     Omega-3 Fatty Acids (FISH OIL PO) Take 2 each by mouth daily. Gummies     rosuvastatin  (CRESTOR ) 20 MG tablet Take 1 tablet (20 mg total) by mouth daily. 90 tablet 3   No facility-administered medications prior to visit.    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 Verlin and Dr. Shyrl.   SVT (supraventricular tachycardia)    benign heart murmur    PAST SURGICAL HISTORY: Past Surgical History:  Procedure Laterality Date   INTRAOPERATIVE TRANSTHORACIC ECHOCARDIOGRAM N/A 01/26/2024   Procedure: ECHOCARDIOGRAM, TRANSTHORACIC;  Surgeon: Verlin Lonni BIRCH, MD;  Location: [facility];  Service: Cardiovascular;  Laterality: N/A;   NO PAST SURGERIES     RIGHT HEART CATH AND CORONARY ANGIOGRAPHY N/A 12/15/2023   Procedure: RIGHT  HEART CATH AND CORONARY ANGIOGRAPHY;  Surgeon: Elmira Newman PARAS, MD;  Location: [facility];  Service: Cardiovascular;  Laterality: N/A;    FAMILY HISTORY: Family History  Problem Relation Age of Onset   Parkinson's disease Paternal Uncle    Seizures Neg Hx    Stroke Neg Hx    Migraines Neg Hx    Sleep apnea Neg Hx     SOCIAL HISTORY: Social History   Socioeconomic History   Marital status: Married    Spouse name: Not on file   Number of children: Not on file   Years of education: Not on file    Highest education level: Not on file  Occupational History   Not on file  Tobacco Use   Smoking status: Never    Passive exposure: Never   Smokeless tobacco: Never  Vaping Use   Vaping status: Never Used  Substance and Sexual Activity   Alcohol use: No   Drug use: No   Sexual activity: Not on file  Other Topics Concern   Not on file  Social History Narrative   1 cup of coffee weekly    Social Drivers of Health   Financial Resource Strain: Not on file  Food Insecurity: No Food Insecurity (01/26/2024)   Hunger Vital Sign    Worried About Running Out of Food in the Last Year: Never true    Ran Out of Food in the Last Year: Never true  Transportation Needs: No Transportation Needs (01/26/2024)   PRAPARE - Administrator, Civil Service (Medical): No    Lack of Transportation (Non-Medical): No  Physical Activity: Not on file  Stress: Not on file  Social Connections: Socially Integrated (01/26/2024)   Social Connection and Isolation Panel    Frequency of Communication with Friends and Family: More than three times a week    Frequency of Social Gatherings with Friends and Family: More than three times a week    Attends Religious Services: More than 4 times per year    Active Member of [employer] or Organizations: Yes    Attends Banker Meetings: More than 4 times per year    Marital Status: Married  Catering Manager Violence: Not At Risk (01/26/2024)   Humiliation, Afraid, Rape, and Kick questionnaire    Fear of Current or Ex-Partner: No    Emotionally Abused: No    Physically Abused: No    Sexually Abused: No     PHYSICAL EXAM  GENERAL EXAM/CONSTITUTIONAL: Vitals:  Vitals:   02/09/24 1357  BP: 116/70  Pulse: 78  SpO2: 99%  Weight: 170 lb 12.8 oz (77.5 kg)  Height: 6' (1.829 m)   Body mass index is 23.16 kg/m. Wt Readings from Last 3 Encounters:  02/09/24 170 lb 12.8 oz (77.5 kg)  02/04/24 170 lb 9.6 oz (77.4 kg)  01/27/24 176 lb 5.9 oz (80  kg)   Patient is in no distress; well developed, nourished and groomed; neck is supple  CARDIOVASCULAR: Examination of carotid arteries is normal; no carotid bruits Regular rate and rhythm, no murmurs Examination of peripheral vascular system by observation and palpation is normal  EYES: Ophthalmoscopic exam of optic discs and posterior segments is normal; no papilledema or hemorrhages No results found.  MUSCULOSKELETAL: Gait, strength, tone, movements noted in Neurologic exam below  NEUROLOGIC: MENTAL STATUS:     02/09/2024    2:07 PM 09/23/2023   11:50 AM  MMSE - Mini Mental State Exam  Orientation to time  5 4  Orientation to Place 5 5  Registration 2 3  Attention/ Calculation 1 4  Recall 2 1  Language- name 2 objects 2 2  Language- repeat 0 1  Language- follow 3 step command 3 3  Language- read & follow direction 1 1  Write a sentence 1 1  Copy design 1 0  Total score 23 25   awake, alert, oriented to person, place and time recent and remote memory intact normal attention and concentration language fluent, comprehension intact, naming intact fund of knowledge appropriate  CRANIAL NERVE:  2nd - no papilledema on fundoscopic exam 2nd, 3rd, 4th, 6th - pupils RIGHT 3-->2, LEFT 2-->1; and reactive to light, visual fields full to confrontation, extraocular muscles intact, no nystagmus 5th - facial sensation symmetric 7th - facial strength symmetric 8th - hearing intact 9th - palate elevates symmetrically, uvula midline 11th - shoulder shrug symmetric 12th - tongue protrusion midline MASKED FACIES  MOTOR:  MILD COGWHEELING RIGIDITY IN BUE BRADYKINESIA (RUE SLOWER THAN LEFT) normal bulk and tone, full strength in the BUE, BLE  SENSORY:  normal and symmetric to light touch, temperature, vibration  COORDINATION:  finger-nose-finger, fine finger movements SLOW ON RIGHT  REFLEXES:  deep tendon reflexes 2+ and symmetric  GAIT/STATION:  narrow based gait; DECR  ARM SWING; ABLE TO WALK ON TOES, HEELS, TANDEM; ROMBERG NEG     DIAGNOSTIC DATA (LABS, IMAGING, TESTING) - I reviewed patient records, labs, notes, testing and imaging myself where available.  Lab Results  Component Value Date   WBC 9.0 01/27/2024   HGB 13.4 01/27/2024   HCT 37.8 (L) 01/27/2024   MCV 93.1 01/27/2024   PLT 169 01/27/2024      Component Value Date/Time   NA 137 01/27/2024 0300   NA 140 12/18/2023 1701   K 3.9 01/27/2024 0300   CL 103 01/27/2024 0300   CO2 24 01/27/2024 0300   GLUCOSE 106 (H) 01/27/2024 0300   BUN 10 01/27/2024 0300   BUN 16 12/18/2023 1701   CREATININE 0.79 01/27/2024 0300   CALCIUM  8.8 (L) 01/27/2024 0300   PROT 6.8 01/22/2024 0829   PROT 6.6 09/23/2023 1335   ALBUMIN 4.1 01/22/2024 0829   ALBUMIN 4.3 09/23/2023 1335   AST 30 01/22/2024 0829   ALT 24 01/22/2024 0829   ALKPHOS 74 01/22/2024 0829   BILITOT 0.9 01/22/2024 0829   BILITOT 1.0 09/23/2023 1335   GFRNONAA >60 01/27/2024 0300   No results found for: CHOL, HDL, LDLCALC, LDLDIRECT, TRIG, CHOLHDL No results found for: YHAJ8R No results found for: VITAMINB12 Lab Results  Component Value Date   TSH 1.046 02/03/2006   Component Ref Range & Units (hover) 4 mo ago  A -- Beta-amyloid 42/40 Ratio 0.099 Low   Beta-amyloid 42 20.03  Beta-amyloid 40 203.11  T -- p-tau181 0.98 High   N -- NfL, Plasma 2.19  ATN SUMMARY Comment  Comment:                        A+ T+ N- A low beta-amyloid 42/40 and a high pTau181 concentration were observed. A normal NfL concentration was observed at this time. These results are consistent with the presence of Alzheimer's related pathology.   10/20/23 DATscan  - Marked decreased activity in the LEFT striatum as described above. Additional decreased activity in the RIGHT stratum to lesser degree. Findings suggestive of Parkinson's syndrome pathology.  10/19/23 MRI brain  - Normal    ASSESSMENT AND PLAN  73 y.o. year old male  here with gradual onset progressive slowing of movements, decreased arm swing, cogwheel rigidity, intermittent tremor, masked facies, mild cognitive changes.  Signs and symptoms most consistent with primary diagnosis of idiopathic Parkinson disease.  ATN panel was slightly abnormal just barely outside normal limits raising possibility of superimposed Alzheimer's pathology but this is less clear-cut.  Dx:  1. 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 - aim for at least 7-8 hours sleep per night (or more) - avoid smoking and alcohol - caution with medications, finances, driving - safety / supervision issues reviewed - parkinson's resources reviewed  Meds ordered this encounter  Medications   carbidopa -levodopa  (SINEMET  IR) 25-100 MG tablet    Sig: Take 1 tablet by mouth 3 (three) times daily before meals.    Dispense:  90 tablet    Refill:  12   Return in about 6 months (around 08/09/2024).  I spent 70 minutes of face-to-face and non-face-to-face time with patient.  This included previsit chart review, lab review, study review, order entry, electronic health record documentation, patient education.  Reviewed diagnosis, prognosis and treatment options.  Explained results of DAT scan testing, ATM testing and MRI results to patient and wife.  Also provided community resources for Parkinson's disease and reviewed personally with them.   EDUARD FABIENE HANLON, MD 02/09/2024, 3:06 PM Certified in Neurology, Neurophysiology and Neuroimaging  [facility] [address], [address] [place], [place] [address] [phone]

## 2024-02-09 NOTE — Patient Instructions (Signed)
  PARKINSON'S DISEASE - start carbidopa  / levodopa  (25/100) half tab three times a day (~30-60 minutes before meals) x 1-2 weeks; then 1 tab three times a day (~30-60 minutes before meals)  - try to stay active physically and get some exercise (at least 15-30 minutes per day) - eat a nutritious diet with lean protein, plants / vegetables, whole grains; avoid ultra-processed foods - increase social activities, brain stimulation, games, puzzles, hobbies, crafts, arts, music; try new activities; keep it fun! - aim for at least 7-8 hours sleep per night (or more) - avoid smoking and alcohol - caution with medications, finances, driving - safety / supervision issues reviewed - parkinson's resources reviewed

## 2024-02-10 ENCOUNTER — Encounter: Payer: Self-pay | Admitting: Pulmonary Disease

## 2024-02-10 ENCOUNTER — Ambulatory Visit: Admitting: Pulmonary Disease

## 2024-02-10 VITALS — BP 120/70 | HR 82 | Temp 98.3°F | Ht 72.0 in | Wt 171.0 lb

## 2024-02-10 DIAGNOSIS — R911 Solitary pulmonary nodule: Secondary | ICD-10-CM | POA: Diagnosis not present

## 2024-02-10 NOTE — Assessment & Plan Note (Signed)
  Orders:   CT Chest Wo Contrast; Future

## 2024-02-10 NOTE — Progress Notes (Unsigned)
 New Patient Pulmonology Office Visit   Subjective:  Patient ID: [name], male    DOB: 1950-06-01  MRN: [id number]  Referred by: Sebastian Lamarr SAUNDERS, PA*  CC:  Chief Complaint  Patient presents with   Consult    Pulmonary nodule on nodule    Discussed the use of AI scribe software for clinical note transcription with the patient, who gave verbal consent to proceed.  History of Present Illness       {PULM QUESTIONNAIRES (Optional):33196}  ROS  Allergies: Patient has no known allergies.  Current Outpatient Medications:    amoxicillin  (AMOXIL ) 500 MG tablet, Take 4 tablets (2,000 mg total) by mouth as directed. 1 hour prior to dental work including cleanings, Disp: 12 tablet, Rfl: 12   aspirin  EC 81 MG tablet, Take 1 tablet (81 mg total) by mouth daily. Swallow whole., Disp: 90 tablet, Rfl: 3   carbidopa -levodopa  (SINEMET  IR) 25-100 MG tablet, Take 1 tablet by mouth 3 (three) times daily before meals., Disp: 90 tablet, Rfl: 12   Cholecalciferol (VITAMIN D-3) 125 MCG (5000 UT) TABS, Take 5,000 Units by mouth daily., Disp: , Rfl:    Cyanocobalamin (VITAMIN B-12 PO), Take 2 each by mouth daily. Gummies, Disp: , Rfl:    memantine  (NAMENDA ) 10 MG tablet, Take 10 mg by mouth 2 (two) times daily., Disp: , Rfl:    Multiple Vitamins-Minerals (ADULT GUMMY PO), Take 3 each by mouth daily. Advanced Multivitamin 50+, Disp: , Rfl:    Multiple Vitamins-Minerals (MULTIVITAMIN WITH MINERALS) tablet, Take 1 tablet by mouth daily. Advanced Multivitamin 50+, Disp: , Rfl:    Omega-3 Fatty Acids (FISH OIL PO), Take 2 each by mouth daily. Gummies, Disp: , Rfl:    rosuvastatin  (CRESTOR ) 20 MG tablet, Take 1 tablet (20 mg total) by mouth daily., Disp: 90 tablet, Rfl: 3 Past Medical History:  Diagnosis Date   Cancer (HCC)    larygneal 2003, ssq L eye used interferon gtts   Gout    OSA (obstructive sleep apnea)    has inspire 2023   S/P TAVR (transcatheter aortic valve replacement)  01/26/2024   s/p TAVR with a 26 mm Edwards Sapien 3 Ultra Resilia THV via the TF approach by Dr. Verlin and Dr. Shyrl.   SVT (supraventricular tachycardia)    benign heart murmur   Past Surgical History:  Procedure Laterality Date   INTRAOPERATIVE TRANSTHORACIC ECHOCARDIOGRAM N/A 01/26/2024   Procedure: ECHOCARDIOGRAM, TRANSTHORACIC;  Surgeon: Verlin Lonni BIRCH, MD;  Location: [facility];  Service: Cardiovascular;  Laterality: N/A;   NO PAST SURGERIES     RIGHT HEART CATH AND CORONARY ANGIOGRAPHY N/A 12/15/2023   Procedure: RIGHT HEART CATH AND CORONARY ANGIOGRAPHY;  Surgeon: Elmira Newman PARAS, MD;  Location: [facility];  Service: Cardiovascular;  Laterality: N/A;   Family History  Problem Relation Age of Onset   Parkinson's disease Paternal Uncle    Seizures Neg Hx    Stroke Neg Hx    Migraines Neg Hx    Sleep apnea Neg Hx    Social History   Socioeconomic History   Marital status: Married    Spouse name: Not on file   Number of children: Not on file   Years of education: Not on file   Highest education level: Not on file  Occupational History   Not on file  Tobacco Use   Smoking status: Never    Passive exposure: Never   Smokeless tobacco: Never  Vaping Use  Vaping status: Never Used  Substance and Sexual Activity   Alcohol use: No   Drug use: No   Sexual activity: Not on file  Other Topics Concern   Not on file  Social History Narrative   1 cup of coffee weekly    Social Drivers of Health   Financial Resource Strain: Not on file  Food Insecurity: No Food Insecurity (01/26/2024)   Hunger Vital Sign    Worried About Running Out of Food in the Last Year: Never true    Ran Out of Food in the Last Year: Never true  Transportation Needs: No Transportation Needs (01/26/2024)   PRAPARE - Administrator, Civil Service (Medical): No    Lack of Transportation (Non-Medical): No  Physical Activity: Not on file  Stress: Not on  file  Social Connections: Socially Integrated (01/26/2024)   Social Connection and Isolation Panel    Frequency of Communication with Friends and Family: More than three times a week    Frequency of Social Gatherings with Friends and Family: More than three times a week    Attends Religious Services: More than 4 times per year    Active Member of [employer] or Organizations: Yes    Attends Engineer, Structural: More than 4 times per year    Marital Status: Married  Catering Manager Violence: Not At Risk (01/26/2024)   Humiliation, Afraid, Rape, and Kick questionnaire    Fear of Current or Ex-Partner: No    Emotionally Abused: No    Physically Abused: No    Sexually Abused: No       Objective:  BP 120/70 (BP Location: Left Arm, Patient Position: Sitting)   Pulse 82   Temp 98.3 F (36.8 C) (Oral)   Ht 6' (1.829 m)   Wt 171 lb (77.6 kg)   SpO2 99% Comment: RA  BMI 23.19 kg/m  {Pulm Vitals (Optional):32837}  Physical Exam  Diagnostic Review:  {Labs (Optional):32838}     Assessment & Plan:   Assessment & Plan Pulmonary nodule  Orders:   CT Chest Wo Contrast; Future   Assessment and Plan Assessment & Plan       No follow-ups on file.   Dorn KATHEE Chill, MD

## 2024-02-10 NOTE — Patient Instructions (Addendum)
 The CT chest scan is showing a 9mm nodule in the right upper lung  We will repeat a CT Chest scan at the end of January  Follow up the first week of February

## 2024-02-19 ENCOUNTER — Encounter: Payer: Self-pay | Admitting: Pulmonary Disease

## 2024-03-07 ENCOUNTER — Ambulatory Visit: Admitting: Neurology

## 2024-03-09 NOTE — Progress Notes (Signed)
 " [facility]   MULTIDISCIPLINARY HEART VALVE CLINIC                                     Cardiology Office Note:    Date:  03/10/2024   ID:  Aloha [name], DOB 10-20-1950, MRN [id number]  PCP:  Verdia Lombard, MD  [facility] Cardiologist:  [name], MD  [facility] Structural heart: Lonni Cash, MD [facility] Electrophysiologist:  None   Referring MD: Verdia Lombard, MD   1 month s/p TAVR  History of Present Illness:    [name] is a 74 y.o. 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   The patient developed progressive SOB. Echo 12/08/23 showed EF 70%, mod MR with calcified chords, severe AS with mean grad 40 mmHg, Vmax 4.46 m/s, AVA 1.17 cm2, DVI 0.34, SVI 47. [facility] 12/15/23 showed moderate distal left main disease, moderate to severe LAD/RPDA disease. Normal filling pressures. Medical therapy was recommended. S/p TAVR with a 26 mm Edwards Sapien 3 Ultra Resilia THV via the TF approach on 01/26/24. Post operative echo showed EF 65%, normally functioning TAVR with a mean gradient of 10 mmHg and no PVL.  Today the patient presents to clinic for follow up. Here with his wife. No CP or SOB. No LE edema, orthopnea or PND. No dizziness or syncope. No blood in stool or urine. No palpitations. He is still hearing things that aren't there but not bothersome. He also sees some squiggles in his vision that are yellow and look like sperm but could be his optical migraines.     Past Medical History:  Diagnosis Date   Cancer (HCC)    larygneal 2003, ssq L eye used interferon gtts   Gout    OSA (obstructive sleep apnea)    has inspire 2023   S/P TAVR (transcatheter aortic valve replacement) 01/26/2024   s/p TAVR with a 26 mm Edwards Sapien 3 Ultra Resilia THV via the TF approach by Dr. Cash and Dr. Shyrl.    SVT (supraventricular tachycardia)    benign heart murmur     Current Medications: Current Meds  Medication Sig   amoxicillin  (AMOXIL ) 500 MG tablet Take 4 tablets (2,000 mg total) by mouth as directed. 1 hour prior to dental work including cleanings   aspirin  EC 81 MG tablet Take 1 tablet (81 mg total) by mouth daily. Swallow whole.   carbidopa -levodopa  (SINEMET  IR) 25-100 MG tablet Take 1 tablet by mouth 3 (three) times daily before meals.   Cholecalciferol (VITAMIN D-3) 125 MCG (5000 UT) TABS Take 5,000 Units by mouth daily.   Cyanocobalamin (VITAMIN B-12 PO) Take 2 each by mouth daily. Gummies   memantine  (NAMENDA ) 10 MG tablet Take 10 mg by mouth 2 (two) times daily.   Multiple Vitamins-Minerals (ADULT GUMMY PO) Take 3 each by mouth daily. Advanced Multivitamin 50+   Multiple Vitamins-Minerals (MULTIVITAMIN WITH MINERALS) tablet Take 1 tablet by mouth daily. Advanced Multivitamin 50+   Omega-3 Fatty Acids (FISH OIL PO) Take 2 each by mouth daily. Gummies   rosuvastatin  (CRESTOR ) 20 MG tablet Take 1 tablet (20 mg total) by mouth daily.      ROS:   Please see the history of present illness.    All other systems reviewed  and are negative.  EKGs       Risk Assessment/Calculations:           Physical Exam:    VS:  BP (!) 106/58   Pulse 70   Ht 6' (1.829 m)   Wt 173 lb (78.5 kg)   SpO2 98%   BMI 23.46 kg/m     Wt Readings from Last 3 Encounters:  03/10/24 173 lb (78.5 kg)  02/10/24 171 lb (77.6 kg)  02/09/24 170 lb 12.8 oz (77.5 kg)     GEN: Well nourished, well developed in no acute distress NECK: No JVD CARDIAC: RRR, no murmurs, rubs, gallops RESPIRATORY:  Clear to auscultation without rales, wheezing or rhonchi  ABDOMEN: Soft, non-tender, non-distended EXTREMITIES:  No edema; No deformity.  ASSESSMENT:    1. S/P TAVR (transcatheter aortic valve replacement)   2. OSA (obstructive sleep apnea)   3. Pulmonary nodule     PLAN:    In order of  problems listed above:  Severe AS s/p TAVR:  -- Echo today shows EF 60%, posterior MVP with mild MR and normally functioning TAVR with a mean gradient of 11 mm hg and no PVL.  -- NYHA class I symptoms walking up to 4 miles recently.  -- SBE prophylaxis discussed; I have RX'd amoxicillin .  -- Continue Aspirin  81mg  daily. -- I will see back for 1 year echo and OV.  OSA: -- Has  Inspire sleep apnea implant.   Pulmonary nodule: -- Pre TAVR CTs noted a 9 mm right upper lobe noncalcified pulmonary nodule is present. Consider one of the following in 3 months for both low-risk and high-risk individuals: (a) repeat chest CT, (b) follow-up PET-CT, or (c) tissue sampling. -- Saw Dr. Kara on 02/10/24 and follow up CT set up for 03/19/24.     Cardiac Rehabilitation Eligibility Assessment  The patient is ready to start cardiac rehabilitation from a cardiac standpoint.     Medication Adjustments/Labs and Tests Ordered: Current medicines are reviewed at length with the patient today.  Concerns regarding medicines are outlined above.  Orders Placed This Encounter  Procedures   ECHOCARDIOGRAM COMPLETE   No orders of the defined types were placed in this encounter.   Patient Instructions  Medication Instructions:  Your physician recommends that you continue on your current medications as directed. Please refer to the Current Medication list given to you today.  *If you need a refill on your cardiac medications before your next appointment, please call your pharmacy*  Lab Work: None needed If you have labs (blood work) drawn today and your tests are completely normal, you will receive your results only by: MyChart Message (if you have MyChart) OR A paper copy in the mail If you have any lab test that is abnormal or we need to change your treatment, we will call you to review the results.  Testing/Procedures: 01/09/2025 Your physician has requested that you have an echocardiogram.  Echocardiography is a painless test that uses sound waves to create images of your heart. It provides your doctor with information about the size and shape of your heart and how well your hearts chambers and valves are working. This procedure takes approximately one hour. There are no restrictions for this procedure. Please do NOT wear cologne, perfume, aftershave, or lotions (deodorant is allowed). Please arrive 15 minutes prior to your appointment time.  Please note: We ask at that you not bring children with you during ultrasound (echo/ vascular) testing. Due to room size  and safety concerns, children are not allowed in the ultrasound rooms during exams. Our front office staff cannot provide observation of children in our lobby area while testing is being conducted. An adult accompanying a patient to their appointment will only be allowed in the ultrasound room at the discretion of the ultrasound technician under special circumstances. We apologize for any inconvenience.   Follow-Up: At [facility], you and your health needs are our priority.  As part of our continuing mission to provide you with exceptional heart care, our providers are all part of one team.  This team includes your primary Cardiologist (physician) and Advanced Practice Providers or APPs (Physician Assistants and Nurse Practitioners) who all work together to provide you with the care you need, when you need it.  Your next appointment:   As scheduled on 01/09/2025  Provider:   Izetta Hummer, PA-C  We recommend signing up for the patient portal called MyChart.  Sign up information is provided on this After Visit Summary.  MyChart is used to connect with patients for Virtual Visits (Telemedicine).  Patients are able to view lab/test results, encounter notes, upcoming appointments, etc.  Non-urgent messages can be sent to your provider as well.   To learn more about what you can do with MyChart, go to  [email or web address].          Signed, Lamarr Hummer, PA-C  03/10/2024 3:07 PM    [facility] "

## 2024-03-10 ENCOUNTER — Ambulatory Visit: Payer: Self-pay | Admitting: Physician Assistant

## 2024-03-10 ENCOUNTER — Ambulatory Visit (INDEPENDENT_AMBULATORY_CARE_PROVIDER_SITE_OTHER): Admitting: Physician Assistant

## 2024-03-10 ENCOUNTER — Ambulatory Visit
Admission: RE | Admit: 2024-03-10 | Discharge: 2024-03-10 | Disposition: A | Discharge status: Home / Self Care | Attending: Cardiovascular Disease | Admitting: Cardiovascular Disease

## 2024-03-10 VITALS — BP 106/58 | HR 70 | Ht 72.0 in | Wt 173.0 lb

## 2024-03-10 DIAGNOSIS — G4733 Obstructive sleep apnea (adult) (pediatric): Secondary | ICD-10-CM | POA: Diagnosis present

## 2024-03-10 DIAGNOSIS — Z952 Presence of prosthetic heart valve: Secondary | ICD-10-CM | POA: Diagnosis present

## 2024-03-10 DIAGNOSIS — R911 Solitary pulmonary nodule: Secondary | ICD-10-CM | POA: Insufficient documentation

## 2024-03-10 LAB — ECHOCARDIOGRAM COMPLETE
AR max vel: 2.38 cm2
AV Area VTI: 2.32 cm2
AV Area mean vel: 2.23 cm2
AV Mean grad: 11 mmHg
AV Peak grad: 21.2 mmHg
Ao pk vel: 2.3 m/s
Area-P 1/2: 4.86 cm2
MV VTI: 2.96 cm2
S' Lateral: 3.1 cm

## 2024-03-10 NOTE — Patient Instructions (Signed)
 Medication Instructions:  Your physician recommends that you continue on your current medications as directed. Please refer to the Current Medication list given to you today.  *If you need a refill on your cardiac medications before your next appointment, please call your pharmacy*  Lab Work: None needed If you have labs (blood work) drawn today and your tests are completely normal, you will receive your results only by: MyChart Message (if you have MyChart) OR A paper copy in the mail If you have any lab test that is abnormal or we need to change your treatment, we will call you to review the results.  Testing/Procedures: 01/09/2025 Your physician has requested that you have an echocardiogram. Echocardiography is a painless test that uses sound waves to create images of your heart. It provides your doctor with information about the size and shape of your heart and how well your hearts chambers and valves are working. This procedure takes approximately one hour. There are no restrictions for this procedure. Please do NOT wear cologne, perfume, aftershave, or lotions (deodorant is allowed). Please arrive 15 minutes prior to your appointment time.  Please note: We ask at that you not bring children with you during ultrasound (echo/ vascular) testing. Due to room size and safety concerns, children are not allowed in the ultrasound rooms during exams. Our front office staff cannot provide observation of children in our lobby area while testing is being conducted. An adult accompanying a patient to their appointment will only be allowed in the ultrasound room at the discretion of the ultrasound technician under special circumstances. We apologize for any inconvenience.   Follow-Up: At [facility], you and your health needs are our priority.  As part of our continuing mission to provide you with exceptional heart care, our providers are all part of one team.  This team includes your primary  Cardiologist (physician) and Advanced Practice Providers or APPs (Physician Assistants and Nurse Practitioners) who all work together to provide you with the care you need, when you need it.  Your next appointment:   As scheduled on 01/09/2025  Provider:   Izetta Hummer, PA-C  We recommend signing up for the patient portal called MyChart.  Sign up information is provided on this After Visit Summary.  MyChart is used to connect with patients for Virtual Visits (Telemedicine).  Patients are able to view lab/test results, encounter notes, upcoming appointments, etc.  Non-urgent messages can be sent to your provider as well.   To learn more about what you can do with MyChart, go to [email or web address].

## 2024-03-19 ENCOUNTER — Ambulatory Visit (HOSPITAL_BASED_OUTPATIENT_CLINIC_OR_DEPARTMENT_OTHER)
Admission: RE | Admit: 2024-03-19 | Discharge: 2024-03-19 | Disposition: A | Source: Ambulatory Visit | Attending: Pulmonary Disease

## 2024-03-19 DIAGNOSIS — R911 Solitary pulmonary nodule: Secondary | ICD-10-CM | POA: Diagnosis present

## 2024-03-25 ENCOUNTER — Telehealth (HOSPITAL_COMMUNITY): Payer: Self-pay

## 2024-03-25 NOTE — Telephone Encounter (Signed)
 Attempted call regarding cardiac rehab- no answer, left message. Sent MyChart message.

## 2024-03-25 NOTE — Telephone Encounter (Signed)
 Patient returned call regarding cardiac rehab, went over program. Patient states he already does some exercise and is not sure he is interested, he will call us  back and let us  know. Will f/u in 2 weeks.

## 2024-03-29 ENCOUNTER — Encounter: Payer: Self-pay | Admitting: Diagnostic Neuroimaging

## 2024-03-29 ENCOUNTER — Ambulatory Visit: Payer: Self-pay | Admitting: Pulmonary Disease

## 2024-03-29 DIAGNOSIS — G20A1 Parkinson's disease without dyskinesia, without mention of fluctuations: Secondary | ICD-10-CM

## 2024-03-29 DIAGNOSIS — R911 Solitary pulmonary nodule: Secondary | ICD-10-CM

## 2024-03-30 ENCOUNTER — Telehealth: Payer: Self-pay | Admitting: Diagnostic Neuroimaging

## 2024-03-30 NOTE — Telephone Encounter (Signed)
 X1 VM/ LM return call

## 2024-03-30 NOTE — Telephone Encounter (Signed)
 Referral for speech therapy sent through [facility] to [facility]. Phone: [phone], Fax: [phone]

## 2024-03-31 NOTE — Telephone Encounter (Signed)
 Spoke with patient [name], PET CT is scheduled for 2/10 f/u has been scheduled.

## 2024-04-12 ENCOUNTER — Encounter (HOSPITAL_COMMUNITY)

## 2024-04-14 ENCOUNTER — Ambulatory Visit

## 2024-04-27 ENCOUNTER — Ambulatory Visit: Admitting: Pulmonary Disease

## 2024-08-23 ENCOUNTER — Ambulatory Visit: Admitting: Cardiology

## 2024-08-29 ENCOUNTER — Ambulatory Visit: Admitting: Diagnostic Neuroimaging

## 2025-01-09 ENCOUNTER — Ambulatory Visit (HOSPITAL_COMMUNITY)

## 2025-01-09 ENCOUNTER — Ambulatory Visit: Admitting: Physician Assistant
